# Patient Record
Sex: Female | Born: 1935 | Race: White | Hispanic: No | Smoking: Never smoker
Health system: Southern US, Community
[De-identification: ages and names within clinical notes are randomized; demographics above are authoritative.]

## PROBLEM LIST (undated history)

## (undated) DIAGNOSIS — F3289 Other specified depressive episodes: Secondary | ICD-10-CM

## (undated) DIAGNOSIS — Z8673 Personal history of transient ischemic attack (TIA), and cerebral infarction without residual deficits: Secondary | ICD-10-CM

## (undated) DIAGNOSIS — M810 Age-related osteoporosis without current pathological fracture: Secondary | ICD-10-CM

## (undated) DIAGNOSIS — I10 Essential (primary) hypertension: Secondary | ICD-10-CM

## (undated) DIAGNOSIS — E079 Disorder of thyroid, unspecified: Secondary | ICD-10-CM

## (undated) DIAGNOSIS — M4800 Spinal stenosis, site unspecified: Secondary | ICD-10-CM

## (undated) DIAGNOSIS — R7611 Nonspecific reaction to tuberculin skin test without active tuberculosis: Secondary | ICD-10-CM

## (undated) DIAGNOSIS — E669 Obesity, unspecified: Secondary | ICD-10-CM

## (undated) DIAGNOSIS — N182 Chronic kidney disease, stage 2 (mild): Secondary | ICD-10-CM

## (undated) DIAGNOSIS — F0280 Dementia in other diseases classified elsewhere without behavioral disturbance: Secondary | ICD-10-CM

## (undated) DIAGNOSIS — N63 Unspecified lump in unspecified breast: Secondary | ICD-10-CM

## (undated) DIAGNOSIS — F329 Major depressive disorder, single episode, unspecified: Secondary | ICD-10-CM

## (undated) DIAGNOSIS — E785 Hyperlipidemia, unspecified: Secondary | ICD-10-CM

## (undated) DIAGNOSIS — R569 Unspecified convulsions: Secondary | ICD-10-CM

## (undated) DIAGNOSIS — K644 Residual hemorrhoidal skin tags: Secondary | ICD-10-CM

## (undated) DIAGNOSIS — G309 Alzheimer's disease, unspecified: Secondary | ICD-10-CM

## (undated) HISTORY — DX: Spinal stenosis, site unspecified: M48.00

## (undated) HISTORY — DX: Major depressive disorder, single episode, unspecified: F32.9

## (undated) HISTORY — DX: Personal history of transient ischemic attack (TIA), and cerebral infarction without residual deficits: Z86.73

## (undated) HISTORY — DX: Age-related osteoporosis without current pathological fracture: M81.0

## (undated) HISTORY — DX: Hyperlipidemia, unspecified: E78.5

## (undated) HISTORY — PX: THYROIDECTOMY: SHX17

## (undated) HISTORY — DX: Alzheimer's disease, unspecified: G30.9

## (undated) HISTORY — DX: Dementia in other diseases classified elsewhere without behavioral disturbance: F02.80

## (undated) HISTORY — DX: Unspecified lump in unspecified breast: N63.0

## (undated) HISTORY — DX: Residual hemorrhoidal skin tags: K64.4

## (undated) HISTORY — DX: Nonspecific reaction to tuberculin skin test without active tuberculosis: R76.11

## (undated) HISTORY — DX: Unspecified convulsions: R56.9

## (undated) HISTORY — DX: Obesity, unspecified: E66.9

## (undated) HISTORY — DX: Chronic kidney disease, stage 2 (mild): N18.2

## (undated) HISTORY — DX: Other specified depressive episodes: F32.89

---

## 1964-11-01 HISTORY — PX: THYROIDECTOMY: SHX17

## 1964-11-01 HISTORY — PX: ABDOMINAL HYSTERECTOMY: SHX81

## 1968-11-01 HISTORY — PX: BACK SURGERY: SHX140

## 2004-11-01 DIAGNOSIS — M810 Age-related osteoporosis without current pathological fracture: Secondary | ICD-10-CM

## 2004-11-01 DIAGNOSIS — E669 Obesity, unspecified: Secondary | ICD-10-CM

## 2004-11-01 HISTORY — DX: Obesity, unspecified: E66.9

## 2004-11-01 HISTORY — DX: Age-related osteoporosis without current pathological fracture: M81.0

## 2005-11-01 DIAGNOSIS — M4800 Spinal stenosis, site unspecified: Secondary | ICD-10-CM

## 2005-11-01 DIAGNOSIS — F0280 Dementia in other diseases classified elsewhere without behavioral disturbance: Secondary | ICD-10-CM

## 2005-11-01 HISTORY — DX: Spinal stenosis, site unspecified: M48.00

## 2005-11-01 HISTORY — DX: Dementia in other diseases classified elsewhere, unspecified severity, without behavioral disturbance, psychotic disturbance, mood disturbance, and anxiety: F02.80

## 2006-05-30 ENCOUNTER — Encounter: Admission: RE | Admit: 2006-05-30 | Discharge: 2006-05-30 | Payer: Self-pay | Admitting: Specialist

## 2006-06-16 ENCOUNTER — Encounter: Admission: RE | Admit: 2006-06-16 | Discharge: 2006-06-16 | Payer: Self-pay | Admitting: Specialist

## 2006-07-02 HISTORY — PX: LUMBAR LAMINECTOMY: SHX95

## 2006-07-13 ENCOUNTER — Inpatient Hospital Stay (HOSPITAL_COMMUNITY): Admission: RE | Admit: 2006-07-13 | Discharge: 2006-07-18 | Payer: Self-pay | Admitting: Specialist

## 2006-07-14 ENCOUNTER — Ambulatory Visit: Payer: Self-pay | Admitting: Physical Medicine & Rehabilitation

## 2006-12-25 IMAGING — RF DG LUMBAR SPINE 2-3V
1 series · 2 of 2 positions shown · non-contrast
Comparison: none

CLINICAL DATA: 76-year-old, L-3 spinal stenosis. 
 LUMBAR SPINE ? 2 VIEW:

[Series 1: run · 2 of 2 slices shown]
[im 1/2]
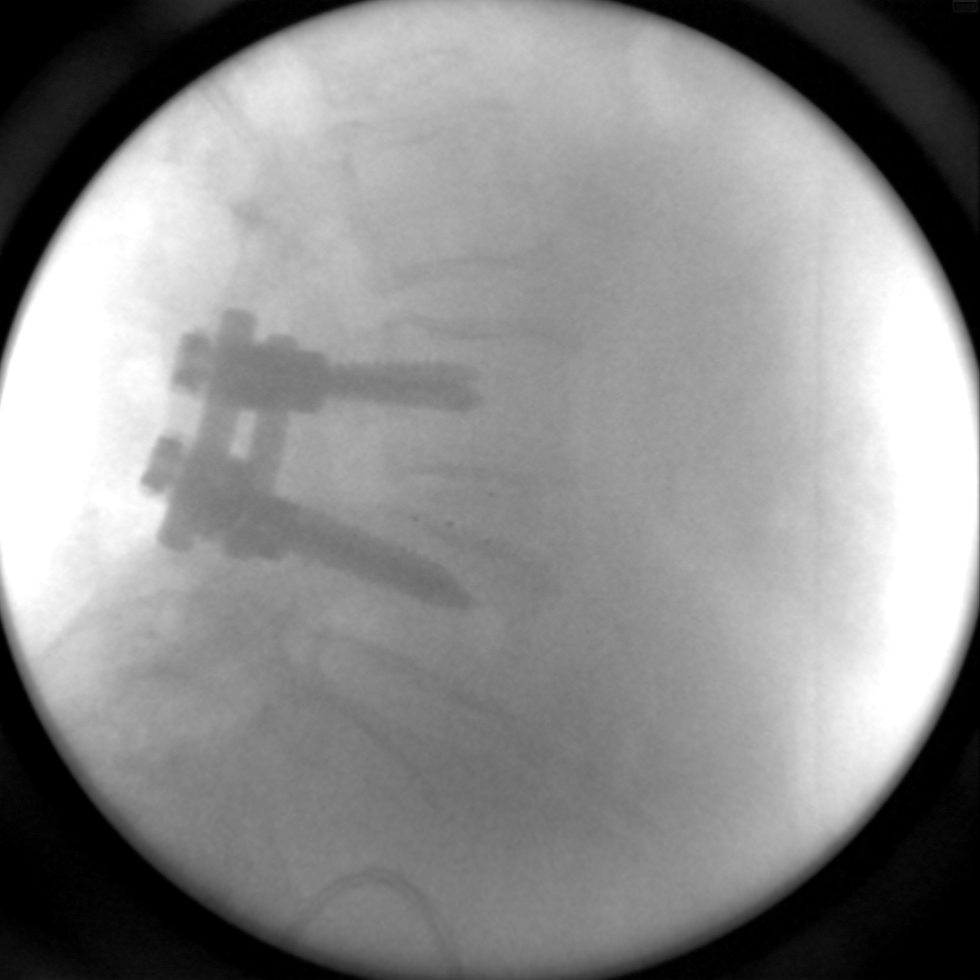
[im 2/2]
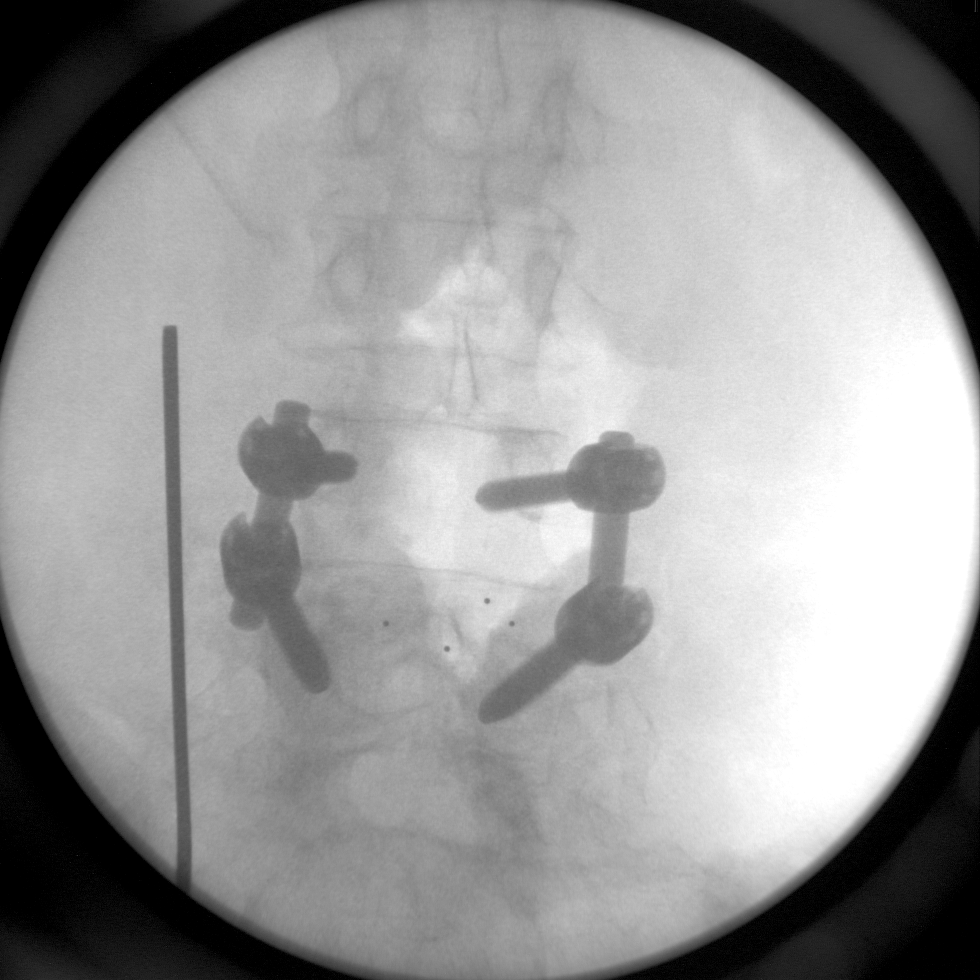

[2 of 2 positions shown; findings below may reference images not displayed]

FINDINGS: Two lumbar spot films from the operating room demonstrate pedicle screws and posterior rods and an interbody bone spacer fusion L4-5.  Hardware appears well positioned and there is normal overall alignment on the lateral film.
IMPRESSION: Posterior fusion hardware and interbody bone spacer at L4-5.

## 2007-11-02 DIAGNOSIS — F32A Depression, unspecified: Secondary | ICD-10-CM

## 2007-11-02 HISTORY — DX: Depression, unspecified: F32.A

## 2008-05-05 ENCOUNTER — Emergency Department (HOSPITAL_COMMUNITY): Admission: EM | Admit: 2008-05-05 | Discharge: 2008-05-05 | Payer: Self-pay | Admitting: Emergency Medicine

## 2008-11-01 DIAGNOSIS — N63 Unspecified lump in unspecified breast: Secondary | ICD-10-CM

## 2008-11-01 DIAGNOSIS — K644 Residual hemorrhoidal skin tags: Secondary | ICD-10-CM

## 2008-11-01 DIAGNOSIS — N182 Chronic kidney disease, stage 2 (mild): Secondary | ICD-10-CM

## 2008-11-01 HISTORY — DX: Unspecified lump in unspecified breast: N63.0

## 2008-11-01 HISTORY — DX: Residual hemorrhoidal skin tags: K64.4

## 2008-11-01 HISTORY — DX: Chronic kidney disease, stage 2 (mild): N18.2

## 2009-02-18 ENCOUNTER — Encounter: Admission: RE | Admit: 2009-02-18 | Discharge: 2009-02-18 | Payer: Self-pay | Admitting: Internal Medicine

## 2009-02-26 ENCOUNTER — Encounter: Admission: RE | Admit: 2009-02-26 | Discharge: 2009-02-26 | Payer: Self-pay | Admitting: Internal Medicine

## 2009-11-01 DIAGNOSIS — R7611 Nonspecific reaction to tuberculin skin test without active tuberculosis: Secondary | ICD-10-CM

## 2009-11-01 HISTORY — DX: Nonspecific reaction to tuberculin skin test without active tuberculosis: R76.11

## 2011-03-19 NOTE — Consult Note (Signed)
[name], [name]               ACCOUNT NO.:  [id number]   MEDICAL RECORD NO.:  [id number]          PATIENT TYPE:  INP   LOCATION:  5020                         FACILITY:  [facility]   PHYSICIAN:  Jonna L. Robb Matar, M.D.DATE OF BIRTH:  05/12/1929   DATE OF CONSULTATION:  DATE OF DISCHARGE:                                   CONSULTATION   PRIMARY CARE PHYSICIAN:  Dr. Evie Lacks.   REASON FOR CONSULTATION:  Diabetes control.   HISTORY OF PRESENT ILLNESS:  The patient, herself, is rather confused and  not able to give me much information, so the information is from the old  records.  The patient has come in for a lumbar laminectomy, L3-4, secondary  to significant spinal stenosis.  She had been having recurring problems with  back pain.   PAST MEDICAL HISTORY:  1. Type 2 diabetes, she has had this for the last 5 years.  Does have      problems with urinary incontinence, but no polydipsia or hypoglycemia.      Sugars were noted to run between 112 and 138, but here in the hospital,      they have run up into the 180s.  She is just on oral therapy.  2. Hypothyroidism.  The patient is post thyroidectomy.  I am not sure what      her original problem was.  There was a note that her TSH had been low      and Synthroid had been adjusted.  That was back last month and it is      due to be rechecked again.  3. Osteoporosis.  4. Hyperlipidemia.   ALLERGIES:  THE PATIENT HAD A DELIRIOUS REACTION TO PERCOCET.   OPERATIONS:  1. Hysterectomy.  2. Thyroidectomy.  3. She did have some previous back surgery in 1970.   FAMILY HISTORY:  Diabetes.   SOCIAL HISTORY:  Nonsmoker.  Occasional wine.  No drugs.  She is a resident  of a skilled nursing facility.  Has 2 children.   MEDICATIONS:  At the skilled nursing facility were:  1. Allegra 180 daily.  2. Aspirin 81 daily.  3. Glyburide 2.5 daily.  4. Glucophage 500 daily.  5. Fosamax 70 weekly.  6. Os-Cal D.  7. Vytorin 10/20 daily.  8.  Synthroid 150 daily.  9. Mobic 15 daily.  10.Aricept 5 daily.  11.Lisinopril 5 daily.  12.Centrum Silver.   REVIEW OF SYSTEMS:  The patient, directly, she says she is not having chest  pain or difficulty breathing.  Her back is sore.  She denies nausea and  vomiting.  She pulled out her IV.  I had to remind her that she had back  surgery.   LABORATORY WORK:  Sugars, as noted, are 182 and 84.  Chest x-ray on  September 11 was normal.  Hemoglobin had dropped to 7.1, but is now up to  10.3.  Renal function good with a BUN of 8, creatinine 0.9.  EKG was normal.   PHYSICAL EXAM:  GENERAL:  Pleasant, confused, pale, elderly white female.  VITAL SIGNS:  Temp 100.6.  Pulse 84.  Respirations 20.  Blood pressure  140/70.  HEENT:  Pupils are slightly constricted.  Extraocular movements are full.  Pharynx is normal.  There are no carotid bruits or thyromegaly.  She has a  scar at the base of her neck.  RESPIRATORY:  Respiratory efforts are normal.  The lungs are clear to A&P  with no wheezing, rhonchi or dullness.  BREASTS:  Show no masses or tenderness.  HEART:  Is a regular rate and rhythm.  Normal S1 and S2 without murmurs,  rubs or gallops.  ABDOMEN:  Is nontender.  No hepatosplenomegaly.  No femoral bruits.  EXTREMITIES:  No cyanosis, clubbing or edema.  BACK:  There is a dressing on her back.   IMPRESSION:  1. Type 2 diabetes.  I will check her A1c, but she is sitting there with a      lot of sugars on her clear liquids.  I suggest putting her back on a      more regular diet and I will increase her metformin to b.i.d.  I do not      think we should start insulin at this time.  2. Hypertension.  This is well controlled.  Continue her ACE inhibitor.  3. Osteoporosis.  4. Hypothyroidism.  We should recheck her T4, TSH and in light of her      confusion I may just check a B12 level since she already has 2      endocrinopathies.  5. Spinal stenosis.  She is post laminectomy.  6.  Hyperlipidemia.  I will check her fasting lipids.  7. Mild dementia.  Continue her Aricept.   Thank you for the consultation.  I would be happy to monitor her medical  issues while she is here.      Jonna L. Robb Matar, M.D.  Electronically Signed     JLB/MEDQ  D:  07/14/2006  T:  07/14/2006  Job:  [id number]

## 2011-03-19 NOTE — Discharge Summary (Signed)
[name], [name]               ACCOUNT NO.:  [id number]   MEDICAL RECORD NO.:  [id number]          PATIENT TYPE:  INP   LOCATION:  5020                         FACILITY:  [facility]   PHYSICIAN:  Kerrin Champagne, M.D.   DATE OF BIRTH:  05-18-1929   DATE OF ADMISSION:  07/13/2006  DATE OF DISCHARGE:  07/18/2006                           DISCHARGE SUMMARY - REFERRING   DISCHARGE DIAGNOSES:  1. Severe lumbar spinal stenosis with concurrent grade 1 spondylolisthesis      L3-4.  2. Type 2 diabetes mellitus.  3. Hypothyroidism.  4. Dyslipidemia.  5. Mild dementia.  6. Hypertension.  7. Urinary incontinence.  8. Status post lumbar decompression and fusion L4 of the sacrum 1970.  9. Status post hysterectomy.   PROCEDURE:  July 13, 2006 - Central laminectomy L3-4 with bilateral L3  and bilateral L4 nerve root decompression. Right sided transforaminal lumbar  inner body fusion L3-4 with 10 mm lordotic leopard cage and local bone  graft. Posterolateral fusion L3-4 with local and allograft Symphony bone  graft. Pedicle screw and rod fixation L3-4.   SURGEON:  Vira Browns, M.D.   ASSISTANT:  Annell Greening, M.D.   ESTIMATED BLOOD LOSS:  500 cc. The patient received 2 units of autogenous  blood and cell saver blood during surgery.   CONSULTATIONS:  On July 14, 2006, Dr. Miachel Roux L. Robb Matar, Internal  Medicine consultation.   CHIEF COMPLAINT:  Severe back pain with increasing debilitation and  inability to walk with urinary incontinence.   HISTORY OF PRESENT ILLNESS:  The patient is a 77 year old female who has  undergone previous lumbar decompression and fusion from L4 to sacrum in  1970's in [place]. She has recently been having increasing  difficulties with standing and ambulation. This has been worsened to the  point where she is nearly all the time in a wheelchair, unable to ambulate  any distance because of severe back pain and weakness in her legs. She has  baseline  urinary incontinence. She was seen and evaluated in the office with  diffuse weakness in both lower extremities. Underwent preoperative studies,  which demonstrated severe lumbar spinal stenosis L3-4, flexion and extension  radiographs as well as myelogram demonstrating movement at the L3-4 segment  of 3 to 4 mm, up to nearly 9 mm, and listhesis at L3 and L4. The patient had  a very short duration of pain relief with epidural steroid. Felt to require  decompression and fusion in order to relieve her pain.   ALLERGIES:  PERCOCET (hallucinations.)   ADMISSION MEDICATIONS:  1. Allegra 180 mg p.o. each a.m.  2. Aspirin 81 mg p.o. each a.m.  3. Glyburide 2.5 mg p.o. each a.m.  4. Fosamax 70 mg p.o. each week.  5. Vytorin 10/20 mg 1 p.o. each a.m.  6. Synthroid 150 mcg 1 tablet p.o. each a.m.  7. Mobic 15 mg p.o. each a.m.  8. Aricept 5 mg p.o. each p.m.  9. Lisinopril 5 mg p.o. each p.m.  10.Glucophage 500 mg p.o. each a.m.  11.Centrum Silver 1 tablet p.o. each a.m.  12.Os-Cal 500 mg b.i.d.  13.Tylenol 500 mg p.o. b.i.d.  14.Oral Peridex q.h.s.  15.Vicodin for discomfort.   FAMILY HISTORY:  Significant for diabetes.   PAST SURGICAL HISTORY:  Hysterectomy and thyroidectomy. She has had previous  lumbar surgery in 1990.   SOCIAL HISTORY:  Non-smoker. Drinks occasional wine. She is a resident at  the [facility]. Has 2 children, both are attentive  to her.   LABORATORY DATA:  Preoperative  lab data showed CBC with hemoglobin and  hematocrit of 11.3 and 33.0 percent. Platelet count of 229,000. The  patient's PT was 13, INR 1.0, PTT 25. Her routine chemistries demonstrated a  sodium of 141, potassium 4.3, chloride 106, CO2 27, glucose 121, BUN 15,  creatinine 1.0, calcium 9.6. Total protein 7.2 and albumin 4.2. The  patient's liver function studies, AST 34, ALT 32. Her ALP was 35, T-bili  0.8. A constellated hemoglobin was 6.0. Lipid profile:  Triglycerides  74,  total cholesterol HDL ratio of 2.4. Her low density lipoprotein and very low  density lipoprotein cholesterol were within normal limits. Her cholesterol  was 100. Her T4 during admission was 8.3 with TSH level of 0.077. Her B12  was 294, normal. Urinalysis during admission showed ketones of 15, otherwise  normal.   A chest x-ray showed chronic parenchymal changes without evidence of acute  or superimposed abnormality, coarse bronchovascular markings, stable.  Tortuous thoracic aorta, no effusion.   Her EKG showed normal sinus rhythm, normal ECG.   HOSPITAL COURSE:  [name] was admitted after undergoing the above  surgical procedure on July 13, 2006. Intraoperatively, she had acute  blood loss with the necessity of autogenous blood transfusions of 2 units.  She was also given cell saver blood during her surgical procedure.  Postoperatively, in the recovery room, a hemoglobin and hematocrit  demonstrated that the patient's hemoglobin was 10.7 and her hematocrit 31.2  percent. Postoperatively, this remained fairly stable with her last test on  July 17, 2006, indicating a hemoglobin of 9.5 and a hematocrit of 27.5  percent. White cell count 6.8,000. The patient recovered in the  postoperative recovery unit and then was transferred to a regular orthopedic  floor where she was log rolled q.2 hours. She had a Hemovac drain, which was  removed on postoperative day 1. She was fitted for a lumbar brace and this  was obtained from [facility] via the orthopedic technician on postoperative  day 1, to be applied when she was out of bed and ambulating. The patient had  excellent bowel sounds throughout her hospitalization. These did not stop  postoperatively. She was tolerating a clear liquid diet on postoperative day  1 and 2. She ran low grade temperatures of up to 101.5 on postoperative day 2. This eventually resolved and diminished to temperatures in the range of  98 to  99. Postoperative chest x-ray was obtained because of low grade  temperatures and showed no active disease. Urinalysis was obtained, which  demonstrated no abnormalities other than the elevation of ketones. The  patient's sugars were controlled and a consultation was obtained for  perioperative maintenance of this patient's medical illnesses including  hypertension and her diabetes. Her sugars remained well controlled in the  120 to 140 to 150 level. She remained with very mild hypertension. On  postoperative day 4, the patient's family had found a nursing home, which  was acceptable to their preferences with [facility]. The  patient, pre-admission, had been staying at an assisted care facility  with  [facility]. However, post-discharge, they would be required to maintain her  previous status as well as an inpatient rehabilitation program. With this,  they felt that a short term stay in a skilled nursing facility would be  appropriate. On postoperative day 3, July 16, 2006, the patient's  bowels were in working order. She voided without difficulty. She did,  however, have infiltration of the IV in the right forearm with swelling of  the right hand and some bruising of her index finger. The swelling markedly  resolved over the next 24 hours. On July 17, 2006, the patient was  seen. Her temperature had decreased and she was running temperatures in the  98 to 99 level. Workup was unremarkable. Workup of hypothyroidism indicated  that the patient likely would require a decrease in her thyroid medications  to 100 mcg per day. She was placed on Nu-iron for postoperative anemia, post-  hemorrhage anemia. Physical therapy continued to work with this patient  throughout her hospitalization and noted that she required continued standby  assistance with nearly maximum assistance in ambulation. She continued to  ambulate with a stoop gait using a walker with plenty of  encouragement. The  patient's Fosamax was held during her hospitalization, as this is a normal  course in this hospital. Her diet remained low carbohydrate, no added salt,  or salty foods. The patient was judged to be stable for transfer to  [facility] on July 18, 2006.   DISCHARGE MEDICATIONS:  1. Claritin 10 mg 1 tablet p.o. daily.  2. Os-Cal Plus D 250 mg of calcium b.i.d.  3. Tylenol 500 mg 1 tablet p.o. b.i.d.  4. Peridex 0.12% oral rinse 15 cc and spit out excess q.h.s.  5. Glyburide 2.5 mg p.o. daily with meals.  6. Vytorin 10/20 tablet, 1 tablet p.o. q. 1800.  7. Mobic 15 mg 1 tablet p.o. daily.  8. Aricept 5 mg 1 tablet p.o. q.h.s.  9. Lisinopril 5 mg p.o. daily.  10.Multivitamin tablet, 1 tablet p.o. daily.  11.MiraLax 17 grams powder dissolved in 8 ounces of water daily.  12.Robaxin 500 mg p.o. q.8 hours p.r.n. spasm.  13.Synthroid 100 mcg p.o. q.12  noon. 14.Vitamin B12 100 mcg p.o. daily.  15.Reglan 10 mg p.o. q.8 hours p.r.n.  16.Compazine 5 mg p.o. q.6 hours p.r.n. nausea.  17.Benadryl 25 mg p.o. q.h.s. p.r.n. sleep.  18.Vicodin 1 to 2 tablets p.o. q.4 to 6 hours p.r.n. pain.  19.Restoril 15 to 30 mg p.o. q.h.s. p.r.n. sleep.   DISPOSITION:  [name] was transferred to [facility] on July 18, 2006 via ambulance.   STATUS:  Ambulation with a brace, lumbosacral orthosis, Aspen type and when  out of bed, to use brace. She may remove the brace while lying in bed. She  is to continue with physical therapy, progressive ambulation, lower  extremity strengthening exercises, to try to improve her endurance.   FOLLOWUP:  She should be seen back in my office at [facility],  telephone number [phone], in 2 weeks from the time of her discharge.   CONDITION ON DISCHARGE:  Improved.      Kerrin Champagne, M.D.  Electronically Signed     JEN/MEDQ  D:  07/17/2006  T:  07/17/2006  Job:  [id number]   cc:   Lonia Blood,  M.D.  Lenon Curt Chilton Si, M.D.

## 2011-03-19 NOTE — Op Note (Signed)
[name], [name]               ACCOUNT NO.:  [id number]   MEDICAL RECORD NO.:  [id number]          PATIENT TYPE:  INP   LOCATION:  5020                         FACILITY:  [facility]   PHYSICIAN:  Kerrin Champagne, M.D.   DATE OF BIRTH:  Sep 14, 1929   DATE OF PROCEDURE:  07/13/2006  DATE OF DISCHARGE:                                 OPERATIVE REPORT   PREOPERATIVE DIAGNOSES:  1. Severe lumbar spinal stenosis L3-4 above and L4 to sacrum fusion.  2. Grade 1 spondylolisthesis L3-4, worsening with flexion.   POSTOPERATIVE DIAGNOSES:  1. Severe lumbar spinal stenosis L3-4 above and L4 to sacrum fusion.  2. Grade 1 spondylolisthesis L3-4, worsening with flexion.   PROCEDURE:  1. Central decompressive laminectomy L3.  2. Decompression of bilateral L3 and bilateral L4 nerve roots.  3. Complete facetectomy right L3-4.  4. Right transforaminal lumbar interbody fusion using a 10-mm lordotic      leopard cage with local bone graft.  5. Posterior lateral fusion L3-L4 using a combination of local and      Symphony allograft bone graft materials.  6. Posterior instrumentation L3-4 using Monarch pedicle screws and rods.  7. Intraoperative EMG and nerve conduction monitoring.   SURGEON:  Kerrin Champagne, M.D.   ASSISTANT:  Veverly Fells. Ophelia Charter, M.D.   ANESTHESIA:  General via oral tracheal intubation, Dr. Janetta Hora. [place].   ESTIMATED BLOOD LOSS:  500 mL.   DRAINS:  Foley to straight drain, Hemovac x1, right lumbar.   HISTORY OF PRESENT ILLNESS:  The patient is a 77 year old female has  undergone previous lumbar laminectomy surgery and fusion from L4 to sacrum  in the 1970s.  She has done well up until nearly 1/2 year ago when she began  having increasing pain and discomfort with standing and ambulation.  She has  progressively worsened to the point where she is primarily in a wheelchair  at the assisted-living facility [facility].  She is unable to stand for any  length of time or ambulate due  to lower extremity weakness.  Work up her  back is demonstrated and severe lumbar spinal stenosis segmented at L3-4  just above her fusion segment from L4 to sacrum with flexion/extension views  she is noted to go from a 2- to 3-mm spondylolisthesis up to nearly 9 mm.  She is brought to the operating room to undergo central decompression of the  L3-4 segment with fusion using a transforaminal lumbar interbody technique  with posterior lateral fusion combined and posterior instrumentation.   INTRAOPERATIVE FINDINGS:  As discussed above.   DESCRIPTION OF PROCEDURE:  After adequate general anesthesia the patient was  placed in the prone position.  Arms at the side well-padded.  Foley catheter  was placed prior to turning.  Bilateral lower extremity TED stockings, full  length were applied as well as intraoperative electrodes on the legs and  large muscles on each lower extremity and upper extremity as well.  The  patient had a standard prep with DuraPrep solution from the lower lumbar  spine to the midsacral segment and also carried up  to the middorsal spine.  She was draped in the usual manner and iodine by drape was used.  She had  standard preoperative antibiotics, Ancef.  The old previous incision scar  was marked out.  The upper half of this was used with the incision.  The  total incision length approximately 4-5 inches in length through the skin  and subcutaneous layers eclipsing the old incision scar at the lower half of  the incision.  Incision was carried sharply down to the lumbodorsal fascia.  Incision was then made on both sides of the lumbodorsal fascia exposing the  posterior spinous processes.  Clamps were then placed on the expected  spinous processes of L3 and L2.  Intraoperative radiograph noted these to be  at L2 and at L3 spinous processes.  Incision was carried caudally.  An  additional segment exposed the spinous process of L4 inferiorly.  Leksell  rongeur,  small  Beyer rongeur was used to mark the spinous process of L3 and  of L2.  Cobbs were used to elevate the paralumbar muscles off the posterior  aspect of the lamina and the lateral aspect of the spinous process of L4-L3  and L2 carrying up to L1.  The paraspinous muscles were stripped laterally.  The facet capsules of the L3-L4 level were resected bilaterally and the  dissection carried lateral exposing the transverse process at the superior  aspect of the L4 level lateral to the L3-L4 facet.  There was bone formation  abundant present along the posterior aspect of the L4-L5 level carrying out  posterolaterally at this segment some of it was debrided over its superior  aspect to allow for exposure of the transverse process bilaterally at the L4  level.  Exposure was also obtained out to the facet of the L2-L3 level.  The  facette capsules at this level were preserved and dissection carried lateral  to the facet exposing the transverse process of L3 bilaterally at this  level.  Each of the lateral gutters were packed to decrease bleeding.  Electrocautery and bipolar electrocautery were used to control bleeders.  Viper retractor was placed in order to obtain excellent exposure of the  transverse processes of L3-L4 to allow for adequate exposure for placement  of pedicle screws.  Next, a Leksell rongeur was used to remove the spinous  process of L3 as well as the superior 25% of the spinous process of L4 and  inferior 30% of spinous process of L2.  The central portions of the lamina  were carefully thinned using Leksell rongeur.  The osteotome 1/2-inch was  used to osteotomized the facet on the right side first removing the inferior  articular facet process on the right side at the L3 level and then  continuing removing bone centrally and on the left side, resecting bone over the inferior articular process, exposing the superior articular process of  L4 on both sides.  A 3-mm Kerrison was introduced  above.  The lamina of L4  was used to resect a small portion of the lamina of L4 superiorly on the  right side and left side in the midline.  This carefully freed up the  ligamentum flavum.  Foraminotomies were performed over both L4 nerve roots.  Osteotomes were then used to osteotomized the superior articular process at  L4 near the medial border of the pedicle of L4.  This was then removed using  2-mm Kerrison's on both sides while decompressing the central canal at the  level of its most significant stenosis.  Central laminectomy was then  carried up to the L2-L3 level using 3 mm Kerrison's resecting central  portions of the lamina superiorly to the L3-L4 level.  Central laminectomy  site was then widened bilaterally using 3 mm Kerrison's performing lateral  recess decompression of the L2-L3 level, both left and right side.  Carrying  the resection on the right side out the L3 neuroforamen resecting the  inferior taken to process totally overlying the foramen on this side then  resecting the residual remnants of the L4 superior articular process on the  right side out the neuroforamen exposing and decompressing the L3 nerve root  in process.  This completed the interval between the L3 nerve root and the  lateral aspect of the thecal sac and was carefully freed up using a Penfield  4.  Bleeders were controlled using bipolar electrocautery in the posterior  lateral aspect of the disk at the L3-4 level and was carefully exposed.  Gelfoam placed for hemostasis.  Bone wax applied to bleeding bone surfaces  were necessary.  A C-arm was brought into the field.  This was first used to  initially identify the spinous process of L3-L4.  Now, it was prepped and in  a lateral position.  The awl was placed at the expected level at the L4  level on the right side and the position within the midportion of the  pedicle.  It was observed on the C-arm to be in adequate positional  alignment.  Using a  straight awl then entry was made at the intersection of  the transverse process near the midsection of the transverse process with  its intersection with the superior articular process of L4 and careful  convergence was carried out as well as a proper degree of angulation.  Lordosis was maintained and observed on the C-arm after initial entry to be  in proper positional alignment carefully guided down the intermedullary  portion of the pedicle.  Carefully feeling the pedicle as it was placed.  A  40-mm screw was chosen.  This was then tapped with a 6.25 tap and a 7 x 4 mm  screw was placed at this level.  At the L3 level on the right side an awl  was placed again within the central portion of the pedicle on the lateral  view and in line with the pedicle and then a pedicle finder used to probe  the pedicle with careful convergence again measuring depth and a 35 mm screw was placed at this level obtaining excellent purchase.  A 6.25 x 35 was  used.  Tapping with a 5.5 tap.  Note that decortication was carried out  between the period of tapping and then the placement of screws.  Bone graft  was placed out posterolateral using Symphony bone graft material and local  bone graft provided.  The left side was then carefully instrumented using an  awl placing the awl at the expected entry point of the midportion of the  pedicle seen on lateral view converging with the handheld pedicle finder  with the proper degree of convergence at L4.  Measuring and carefully  testing the integrity of the pedicle using a ball-tipped probe in between  probing with the pedicle prober and then tapping and after tapping and then  placing the screw.  Again high-speed burr was used carefully to decorticate  the transverse process at L4, to measuring and tapping with a 6.25 tap and  placing a 40 mm screw on the left side at the L4 level.  At the L3 level  again an awl was used to carefully align the end and determine the  entry  point into the pedicle and the midportion of the pedicle.  Careful  convergence with the hand-held pedicle finder converging properly  in the  lordotic nature of the pedicle.  This was then measured for depth and tapped  with a 5.5 tap, ball-tipped probe used to ensure the integrity of the  pedicle and a 35 x 6.25 screw was placed at the L3 level on the left side  without difficulty was noted.  Again, decortication was carried out between  the tapping of the pedicle and the placement of the screw and placement of  the bone graft posterolateral was performed on both sides.  Each of the  pedicle screws were then measured for soft tissue resistance indicating  between 35 and 40 at each level.  This was adequate.  This completed each of  the fasteners so the screws were carefully loosened up using the head  loosener provided.  The smallest length curved rod 45 mm in length was then  placed into the fasteners of the screws at the L3-L4 level on both sides and  caps were then applied to the screw fasteners at each segment.  Returning to  the right side then using the Rico retractors, it carefully retracted the L3  nerve root and the thecal sac.  The incision was made in the posterolateral  corner of the disk using 15-blade scalpel.  The disk was then debrided of  any residual disk material using pituitary rongeurs, a laminar spreader was  used to carefully spread the lamina between the L4 and the L2 level.  Disk  space was then entered and dilated using a 8-, 9- and 10-mm dilators.  Pituitary rongeurs were then used to debride the disk space of further  degenerative disk material.  The endplates were then decorticated using the  curettes provided.  Straight curettes upbiting right and upbiting left  curettes as well as ring curettes.  This completed debridement of the  endplates of any cartilaginous material down to bleeding bone endplates.  As best as possible the bony endplates were  preserved.  Irrigation was carried  out of the disk space.  Trialing the disk space, a 9-mm then 10-mm lordotic  cage was chosen.  Paravertebral disk space was then packed with morselized  local bone graft for much as possible.  Then using a trial 8 mm impactor of  a trial cage was then carried out.  This impacted the bone graft into good  positional alignment allowing for space for the placement of the expected 10  mm cage.  The 10 mm cage was then carefully packed with additional local  bone graft material.  The cage was then placed into the disk space and  impacted into place.  It was then carefully rotated with the kicker  impactors to a transverse position of the midline.  With this completed then  the soft tissues were allowed to fall back into place.  Bleeders were  controlled using bipolar electrocautery.  Hockey stick neural probe was used  to probe the nerves in both L4 and in both the L3 nerve roots and ensure  patency of their neuroforamen.  No further nerve compression.  None was  present.  There was no disk material or bony material within the spinal  canal noted.  With then the fasteners at the L3 level were carefully  connected to 85 pounds using the Danek torque arm holding device and the  torque wrench.  Compression was obtained between the L3 and the L4 screws  using a compressor provided and on the right side the L4 fastener was  tightened to the rod to the 85 pounds.  Then on the left side similarly with  compression across the two screw heads, the L4 fastener was tightened to the  rod.  Irrigation was carried out.  Platelet portion of the patient's plasma  was then sprayed on to the wound to provide hemostasis.  Medium Hemovac  drain was placed in the depth of the incision exiting over the right lower  lumbar region.  Lumbodorsal fascia was approximated after first  approximating some of the muscle layers in the midline at the L3 segment  using #1 Vicryl sutures.   Lumbodorsal fascia was approximated with  interrupted figure-of-eight simple sutures of #1 Vicryl.  Deep subcu layers  were approximated with interrupted #1 and then zero Vicryl sutures and used  to close deep subcu and the more superficial subcu layers approximated  interrupted 2-0 Vicryl sutures and skin closed with a running subcu stitch  of 4-0 Vicryl.  Tincher of benzoin and Steri-Strips applied; 4x4s and ABD  pad were affixed to the skin with Hypafix tape.  Hemovac drain attached to  the dressing with pink tape.  Hemovac drain was charged.  The patient was  then returned to the supine position, reactivated, extubated and returned to  the recovery room in satisfactory condition.  Note that at the end of the  case permanent C-arm images were obtained in  the A/P and lateral planes demonstrating the screws to be converging well  within in the body of L3 and L4 and the cage to be in excellent positional alignment within the midline.  The patient was returned to the recovery room  in satisfactory condition.  Also instrument and sponge counts were correct.      Kerrin Champagne, M.D.  Electronically Signed     JEN/MEDQ  D:  07/15/2006  T:  07/18/2006  Job:  [id number]

## 2011-09-02 DIAGNOSIS — Z8673 Personal history of transient ischemic attack (TIA), and cerebral infarction without residual deficits: Secondary | ICD-10-CM

## 2011-09-02 HISTORY — DX: Personal history of transient ischemic attack (TIA), and cerebral infarction without residual deficits: Z86.73

## 2011-09-12 ENCOUNTER — Emergency Department (HOSPITAL_COMMUNITY): Payer: Medicare Other

## 2011-09-12 ENCOUNTER — Encounter: Payer: Self-pay | Admitting: Emergency Medicine

## 2011-09-12 ENCOUNTER — Inpatient Hospital Stay (HOSPITAL_COMMUNITY)
Admission: EM | Admit: 2011-09-12 | Discharge: 2011-09-15 | DRG: 065 | Disposition: A | Discharge status: Skilled Nursing Facility | Payer: Medicare Other | Attending: Internal Medicine | Admitting: Internal Medicine

## 2011-09-12 DIAGNOSIS — I6350 Cerebral infarction due to unspecified occlusion or stenosis of unspecified cerebral artery: Principal | ICD-10-CM | POA: Diagnosis present

## 2011-09-12 DIAGNOSIS — R5381 Other malaise: Secondary | ICD-10-CM | POA: Diagnosis present

## 2011-09-12 DIAGNOSIS — G8191 Hemiplegia, unspecified affecting right dominant side: Secondary | ICD-10-CM | POA: Diagnosis present

## 2011-09-12 DIAGNOSIS — N390 Urinary tract infection, site not specified: Secondary | ICD-10-CM | POA: Diagnosis present

## 2011-09-12 DIAGNOSIS — G8190 Hemiplegia, unspecified affecting unspecified side: Secondary | ICD-10-CM | POA: Diagnosis present

## 2011-09-12 DIAGNOSIS — E119 Type 2 diabetes mellitus without complications: Secondary | ICD-10-CM | POA: Diagnosis present

## 2011-09-12 DIAGNOSIS — I639 Cerebral infarction, unspecified: Secondary | ICD-10-CM

## 2011-09-12 DIAGNOSIS — Z79899 Other long term (current) drug therapy: Secondary | ICD-10-CM

## 2011-09-12 DIAGNOSIS — Z794 Long term (current) use of insulin: Secondary | ICD-10-CM

## 2011-09-12 DIAGNOSIS — E039 Hypothyroidism, unspecified: Secondary | ICD-10-CM | POA: Diagnosis present

## 2011-09-12 DIAGNOSIS — Z7902 Long term (current) use of antithrombotics/antiplatelets: Secondary | ICD-10-CM

## 2011-09-12 DIAGNOSIS — Z66 Do not resuscitate: Secondary | ICD-10-CM | POA: Diagnosis present

## 2011-09-12 DIAGNOSIS — I10 Essential (primary) hypertension: Secondary | ICD-10-CM | POA: Diagnosis present

## 2011-09-12 DIAGNOSIS — I509 Heart failure, unspecified: Secondary | ICD-10-CM | POA: Diagnosis present

## 2011-09-12 DIAGNOSIS — Z7982 Long term (current) use of aspirin: Secondary | ICD-10-CM

## 2011-09-12 DIAGNOSIS — E1122 Type 2 diabetes mellitus with diabetic chronic kidney disease: Secondary | ICD-10-CM | POA: Diagnosis present

## 2011-09-12 DIAGNOSIS — Z833 Family history of diabetes mellitus: Secondary | ICD-10-CM

## 2011-09-12 DIAGNOSIS — E785 Hyperlipidemia, unspecified: Secondary | ICD-10-CM | POA: Diagnosis present

## 2011-09-12 DIAGNOSIS — F0390 Unspecified dementia without behavioral disturbance: Secondary | ICD-10-CM | POA: Diagnosis present

## 2011-09-12 DIAGNOSIS — I5032 Chronic diastolic (congestive) heart failure: Secondary | ICD-10-CM | POA: Diagnosis present

## 2011-09-12 HISTORY — DX: Essential (primary) hypertension: I10

## 2011-09-12 HISTORY — DX: Disorder of thyroid, unspecified: E07.9

## 2011-09-12 LAB — COMPREHENSIVE METABOLIC PANEL
Albumin: 3.3 g/dL — ABNORMAL LOW (ref 3.5–5.2)
Alkaline Phosphatase: 56 U/L (ref 39–117)
BUN: 19 mg/dL (ref 6–23)
Chloride: 98 mEq/L (ref 96–112)
Glucose, Bld: 135 mg/dL — ABNORMAL HIGH (ref 70–99)
Potassium: 3.9 mEq/L (ref 3.5–5.1)
Total Bilirubin: 0.4 mg/dL (ref 0.3–1.2)

## 2011-09-12 LAB — CBC
HCT: 37.4 % (ref 36.0–46.0)
Hemoglobin: 12.1 g/dL (ref 12.0–15.0)
MCH: 31.6 pg (ref 26.0–34.0)
MCV: 97.7 fL (ref 78.0–100.0)
RBC: 3.83 MIL/uL — ABNORMAL LOW (ref 3.87–5.11)

## 2011-09-12 LAB — URINALYSIS, ROUTINE W REFLEX MICROSCOPIC
Bilirubin Urine: NEGATIVE
Nitrite: POSITIVE — AB
Specific Gravity, Urine: 1.034 — ABNORMAL HIGH (ref 1.005–1.030)
pH: 5.5 (ref 5.0–8.0)

## 2011-09-12 LAB — URINE MICROSCOPIC-ADD ON

## 2011-09-12 LAB — GLUCOSE, CAPILLARY: Glucose-Capillary: 131 mg/dL — ABNORMAL HIGH (ref 70–99)

## 2011-09-12 MED ORDER — DEXTROSE 5 % IV SOLN
1.0000 g | Freq: Once | INTRAVENOUS | Status: AC
  Administered 2011-09-13: 1 g via INTRAVENOUS
  Filled 2011-09-12: qty 10

## 2011-09-12 NOTE — ED Notes (Addendum)
Dr.glick asked to speak with pt

## 2011-09-12 NOTE — ED Notes (Signed)
Dr. glick at bedside 

## 2011-09-12 NOTE — ED Notes (Signed)
Lab in to draw blood.

## 2011-09-12 NOTE — ED Notes (Signed)
md informed that pt has deficits to right side based on charting rn assessment

## 2011-09-12 NOTE — ED Notes (Signed)
Per ems pt sent from facility with right sided weakness that began around 4 pm today and left sided weakness since this morning. Per ems facility stated pt had left sided facial droop. Per ems pt shows no weakness to left or right side nor left sided facial droop. Pt has hx alzheimers. Bilateral grips strong

## 2011-09-12 NOTE — ED Provider Notes (Signed)
History     CSN: [id number] Arrival date & time: 09/12/2011  6:21 PM   First MD Initiated Contact with Patient 09/12/11 1844      Chief Complaint  Patient presents with  . Weakness    (Consider location/radiation/quality/duration/timing/severity/associated sxs/prior treatment) Patient is a 77 y.o. female presenting with weakness. The history is provided by the nursing home and a relative. History Limited By: History is limited by dementia.  Weakness  Additional symptoms include weakness.   77 year old female was sent from a nursing home because of left-sided weakness. She was reported to have shown a left facial droop and left-sided weakness at 1600. It is not known when the last time she was seen normal. She is DO NOT RESUSCITATE. Patient is unable to give any relevant history because of severe dementia. Family member is here he states that her speech is somewhat dysarthric compared to baseline and also states that her inability to answer questions is less than her baseline.  Past Medical History  Diagnosis Date  . Hypertension   . Diabetes mellitus   . Thyroid disease     No past surgical history on file.  No family history on file.  History  Substance Use Topics  . Smoking status: Unknown If Ever Smoked  . Smokeless tobacco: Not on file  . Alcohol Use: No    OB History    Grav Para Term Preterm Abortions TAB SAB Ect Mult Living                  Review of Systems  Unable to perform ROS Neurological: Positive for weakness.    Allergies  Roxicet  Home Medications  No current outpatient prescriptions on file.  BP 150/98  Pulse 100  Resp 18  Physical Exam  Nursing note and vitals reviewed.  77 year old female awake and alert and in no acute distress. Vital signs show mild hypertension with blood pressure 150/98. PERRLA, EOMI. Fundi show no hemorrhages or exudates. Mild conjunctival drainage is present bilaterally. There is a very mild right central facial  droop present. Neck is supple without adenopathy, JVD, or bruit. Lungs are clear without rales wheezes or rhonchi. Heart regular rate and rhythm without rub. Abdomen is soft flat nontender with peristalsis present. Extremities show 1+ edema. Skin a faint reticular rash is present over the right ankle and foot and is nonspecific in appearance. Neurologic: She is oriented to person but not to place or time. Speech is slightly slow and mildly dysarthric. There is a very mild right central facial droop without any other abnormal cranial nerves. There is very mild weakness of the right arm which is 4/5 strength. She would not cooperate with pronator drift testing and she will not cooperate with leg strength testing. There is no gross difference in leg strength. Deep tendon reflexes are symmetric. No Babinski response is seen.  ED Course  Procedures (including critical care time)  Results for orders placed during the hospital encounter of 09/12/11  CBC      Component Value Range   WBC 9.5  4.0 - 10.5 (K/uL)   RBC 3.83 (*) 3.87 - 5.11 (MIL/uL)   Hemoglobin 12.1  12.0 - 15.0 (g/dL)   HCT 62.9  52.8 - 41.3 (%)   MCV 97.7  78.0 - 100.0 (fL)   MCH 31.6  26.0 - 34.0 (pg)   MCHC 32.4  30.0 - 36.0 (g/dL)   RDW 24.4  01.0 - 27.2 (%)   Platelets 220  150 -  400 (K/uL)  TROPONIN I      Component Value Range   Troponin I <0.30  <0.30 (ng/mL)  URINALYSIS, ROUTINE W REFLEX MICROSCOPIC      Component Value Range   Color, Urine YELLOW  YELLOW    Appearance TURBID (*) CLEAR    Specific Gravity, Urine 1.034 (*) 1.005 - 1.030    pH 5.5  5.0 - 8.0    Glucose, UA NEGATIVE  NEGATIVE (mg/dL)   Hgb urine dipstick MODERATE (*) NEGATIVE    Bilirubin Urine NEGATIVE  NEGATIVE    Ketones, ur 15 (*) NEGATIVE (mg/dL)   Protein, ur 161 (*) NEGATIVE (mg/dL)   Urobilinogen, UA 0.2  0.0 - 1.0 (mg/dL)   Nitrite POSITIVE (*) NEGATIVE    Leukocytes, UA LARGE (*) NEGATIVE   COMPREHENSIVE METABOLIC PANEL      Component Value  Range   Sodium 137  135 - 145 (mEq/L)   Potassium 3.9  3.5 - 5.1 (mEq/L)   Chloride 98  96 - 112 (mEq/L)   CO2 24  19 - 32 (mEq/L)   Glucose, Bld 135 (*) 70 - 99 (mg/dL)   BUN 19  6 - 23 (mg/dL)   Creatinine, Ser 0.96  0.50 - 1.10 (mg/dL)   Calcium 04.5  8.4 - 10.5 (mg/dL)   Total Protein 7.6  6.0 - 8.3 (g/dL)   Albumin 3.3 (*) 3.5 - 5.2 (g/dL)   AST 37  0 - 37 (U/L)   ALT 39 (*) 0 - 35 (U/L)   Alkaline Phosphatase 56  39 - 117 (U/L)   Total Bilirubin 0.4  0.3 - 1.2 (mg/dL)   GFR calc non Af Amer 46 (*) >90 (mL/min)   GFR calc Af Amer 54 (*) >90 (mL/min)  GLUCOSE, CAPILLARY      Component Value Range   Glucose-Capillary 131 (*) 70 - 99 (mg/dL)  URINE MICROSCOPIC-ADD ON      Component Value Range   Squamous Epithelial / LPF MANY (*) RARE    WBC, UA TOO NUMEROUS TO COUNT  <3 (WBC/hpf)   RBC / HPF 11-20  <3 (RBC/hpf)   Bacteria, UA MANY (*) RARE    Ct Head Wo Contrast  09/12/2011  *RADIOLOGY REPORT*  Clinical Data: 77 year old female with left side weakness, altered level of consciousness.  CT HEAD WITHOUT CONTRAST  Technique:  Contiguous axial images were obtained from the base of the skull through the vertex without contrast.  Comparison: None.  Findings: Left side and bilateral sphenoid paranasal sinus mucoperiosteal thickening with complete left side sinus opacification. There is also some opacification of the left mastoids.  The visualized nasopharynx is unremarkable.  The right mastoids and paranasal sinuses are better pneumatized. No acute osseous abnormality identified.  No acute orbit or scalp soft tissue findings.  Ventriculomegaly.  No evidence of transependymal edema.  There is generalized cerebral volume loss. No midline shift, mass effect, or evidence of mass lesion.  No acute intracranial hemorrhage identified.  No evidence of cortically based acute infarction identified.  No suspicious intracranial vascular hyperdensity.  IMPRESSION: 1.  Ventriculomegaly without evidence of  acute hydrocephalus could be related to normal pressure hydrocephalus and/or cerebral volume loss. 2.  No evidence of acute cortically based infarction or other acute intracranial abnormality. 3.  Chronic paranasal sinusitis.  Original Report Authenticated By: Harley Hallmark, M.D.   Dg Chest Portable 1 View  09/12/2011  *RADIOLOGY REPORT*  Clinical Data: Weakness and lethargy today.  PORTABLE CHEST - 1 VIEW  Comparison: 07/16/2006  Findings: Heart size and vascularity are normal and the lungs are clear.  No osseous abnormality.  Tortuosity and calcification of the thoracic aorta.  IMPRESSION: No acute abnormalities.  Original Report Authenticated By: Gwynn Burly, M.D.      Date: 09/12/2011  Rate: 101  Rhythm: sinus tachycardia  QRS Axis: normal  Intervals: normal  ST/T Wave abnormalities: nonspecific ST/T changes  Conduction Disutrbances:none  Narrative Interpretation: Inferior wall Q waves which are new, low voltage in the chest leads along with poor R-wave progression which is also new. Changes are compared with ECG of 07/12/2006  Old EKG Reviewed: changes noted Urinalysis shows UTI, since you started on IV Rocephin. CT scan showed atrophy but no evidence of bleed. 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 She will need to be admitted for neurologic observation as well as starting her on IV antibiotics. Consultation has been obtained with Dr. Toniann Fail who agrees to admit the patient. Findings were discussed with a family as well as the rationale for not treating with thrombolytics.   MDM  Patient is not a code stroke based on uncertain time of onset. She is not a candidate for thrombolytic therapy based on very mild deficit, uncertain time of onset, and also the patient is DO NOT RESUSCITATE.        Dione Booze, MD 09/12/11 [phone]

## 2011-09-12 NOTE — ED Notes (Signed)
Family at bedside. 

## 2011-09-12 NOTE — ED Notes (Signed)
cbg 179 per ems

## 2011-09-13 ENCOUNTER — Encounter (HOSPITAL_COMMUNITY): Payer: Self-pay | Admitting: Internal Medicine

## 2011-09-13 ENCOUNTER — Inpatient Hospital Stay (HOSPITAL_COMMUNITY): Payer: Medicare Other

## 2011-09-13 DIAGNOSIS — E1122 Type 2 diabetes mellitus with diabetic chronic kidney disease: Secondary | ICD-10-CM | POA: Diagnosis present

## 2011-09-13 DIAGNOSIS — N390 Urinary tract infection, site not specified: Secondary | ICD-10-CM | POA: Diagnosis present

## 2011-09-13 DIAGNOSIS — G8191 Hemiplegia, unspecified affecting right dominant side: Secondary | ICD-10-CM | POA: Diagnosis present

## 2011-09-13 DIAGNOSIS — F0390 Unspecified dementia without behavioral disturbance: Secondary | ICD-10-CM | POA: Diagnosis present

## 2011-09-13 LAB — COMPREHENSIVE METABOLIC PANEL
AST: 25 U/L (ref 0–37)
BUN: 16 mg/dL (ref 6–23)
CO2: 30 mEq/L (ref 19–32)
Chloride: 98 mEq/L (ref 96–112)
Creatinine, Ser: 1.02 mg/dL (ref 0.50–1.10)
GFR calc Af Amer: 59 mL/min — ABNORMAL LOW (ref >90)
GFR calc non Af Amer: 51 mL/min — ABNORMAL LOW (ref >90)
Glucose, Bld: 167 mg/dL — ABNORMAL HIGH (ref 70–99)
Total Bilirubin: 0.4 mg/dL (ref 0.3–1.2)

## 2011-09-13 LAB — TSH: TSH: 1.659 u[IU]/mL (ref 0.350–4.500)

## 2011-09-13 LAB — HEMOGLOBIN A1C: Mean Plasma Glucose: 134 mg/dL — ABNORMAL HIGH (ref <117)

## 2011-09-13 LAB — CBC
MCH: 31.4 pg (ref 26.0–34.0)
MCHC: 31.8 g/dL (ref 30.0–36.0)
Platelets: 187 10*3/uL (ref 150–400)
RBC: 3.6 MIL/uL — ABNORMAL LOW (ref 3.87–5.11)

## 2011-09-13 LAB — MRSA PCR SCREENING: MRSA by PCR: NEGATIVE

## 2011-09-13 MED ORDER — CEFTRIAXONE SODIUM 1 G IJ SOLR
1.0000 g | INTRAMUSCULAR | Status: DC
  Administered 2011-09-14 (×2): 1 g via INTRAVENOUS
  Filled 2011-09-13 (×3): qty 10

## 2011-09-13 MED ORDER — LEVOTHYROXINE SODIUM 100 MCG PO TABS
100.0000 ug | ORAL_TABLET | Freq: Every day | ORAL | Status: DC
  Administered 2011-09-14 – 2011-09-15 (×2): 100 ug via ORAL
  Filled 2011-09-13 (×4): qty 1

## 2011-09-13 MED ORDER — LISINOPRIL 5 MG PO TABS
5.0000 mg | ORAL_TABLET | Freq: Every day | ORAL | Status: DC
  Administered 2011-09-13 – 2011-09-15 (×3): 5 mg via ORAL
  Filled 2011-09-13 (×3): qty 1

## 2011-09-13 MED ORDER — ENOXAPARIN SODIUM 40 MG/0.4ML ~~LOC~~ SOLN
40.0000 mg | SUBCUTANEOUS | Status: DC
  Administered 2011-09-13 – 2011-09-14 (×2): 40 mg via SUBCUTANEOUS
  Filled 2011-09-13 (×3): qty 0.4

## 2011-09-13 MED ORDER — CHLORHEXIDINE GLUCONATE 0.12 % MT SOLN
15.0000 mL | Freq: Two times a day (BID) | OROMUCOSAL | Status: DC
  Administered 2011-09-13 – 2011-09-15 (×4): 15 mL via OROMUCOSAL
  Filled 2011-09-13 (×6): qty 15

## 2011-09-13 MED ORDER — CLOPIDOGREL BISULFATE 75 MG PO TABS
75.0000 mg | ORAL_TABLET | Freq: Every day | ORAL | Status: DC
  Administered 2011-09-13 – 2011-09-15 (×3): 75 mg via ORAL
  Filled 2011-09-13 (×5): qty 1

## 2011-09-13 MED ORDER — INSULIN GLARGINE 100 UNIT/ML ~~LOC~~ SOLN
12.0000 [IU] | Freq: Every day | SUBCUTANEOUS | Status: DC
Start: 2011-09-13 — End: 2011-09-15
  Administered 2011-09-13 – 2011-09-14 (×2): 12 [IU] via SUBCUTANEOUS
  Filled 2011-09-13: qty 3

## 2011-09-13 MED ORDER — SIMVASTATIN 20 MG PO TABS
20.0000 mg | ORAL_TABLET | Freq: Every day | ORAL | Status: DC
  Administered 2011-09-13 – 2011-09-14 (×2): 20 mg via ORAL
  Filled 2011-09-13 (×3): qty 1

## 2011-09-13 MED ORDER — CALCIUM CARBONATE-VITAMIN D 500-200 MG-UNIT PO TABS
1.0000 | ORAL_TABLET | Freq: Two times a day (BID) | ORAL | Status: DC
  Administered 2011-09-13 (×2): 1 via ORAL
  Administered 2011-09-14: 23:00:00 via ORAL
  Administered 2011-09-14 – 2011-09-15 (×2): 1 via ORAL
  Filled 2011-09-13 (×7): qty 1

## 2011-09-13 MED ORDER — SENNOSIDES-DOCUSATE SODIUM 8.6-50 MG PO TABS: 1.0000 | ORAL_TABLET | Freq: Every evening | ORAL | Status: DC | PRN

## 2011-09-13 MED ORDER — POLYETHYLENE GLYCOL 3350 17 G PO PACK
17.0000 g | PACK | Freq: Every day | ORAL | Status: DC
  Administered 2011-09-13 – 2011-09-15 (×3): 17 g via ORAL
  Filled 2011-09-13 (×3): qty 1

## 2011-09-13 MED ORDER — SODIUM CHLORIDE 0.9 % IV SOLN
INTRAVENOUS | Status: DC
  Administered 2011-09-13: 1000 mL via INTRAVENOUS

## 2011-09-13 MED ORDER — ACETAMINOPHEN 500 MG PO TABS
1000.0000 mg | ORAL_TABLET | Freq: Two times a day (BID) | ORAL | Status: DC
  Administered 2011-09-13 – 2011-09-15 (×5): 1000 mg via ORAL
  Filled 2011-09-13 (×7): qty 2

## 2011-09-13 MED ORDER — INSULIN ASPART 100 UNIT/ML ~~LOC~~ SOLN
0.0000 [IU] | Freq: Three times a day (TID) | SUBCUTANEOUS | Status: DC
  Administered 2011-09-13 – 2011-09-15 (×3): 2 [IU] via SUBCUTANEOUS
  Filled 2011-09-13: qty 3

## 2011-09-13 MED ORDER — TRAMADOL HCL 50 MG PO TABS
50.0000 mg | ORAL_TABLET | Freq: Four times a day (QID) | ORAL | Status: DC | PRN
  Filled 2011-09-13: qty 1

## 2011-09-13 MED ORDER — TRAMADOL HCL 50 MG PO TABS
50.0000 mg | ORAL_TABLET | Freq: Every day | ORAL | Status: DC
  Administered 2011-09-13 – 2011-09-15 (×3): 50 mg via ORAL
  Filled 2011-09-13 (×3): qty 1

## 2011-09-13 MED ORDER — HYDROCORTISONE 2.5 % RE CREA
1.0000 "application " | TOPICAL_CREAM | RECTAL | Status: DC | PRN
  Filled 2011-09-13: qty 28.35

## 2011-09-13 MED ORDER — DONEPEZIL HCL 5 MG PO TABS
5.0000 mg | ORAL_TABLET | Freq: Every day | ORAL | Status: DC
  Administered 2011-09-13 – 2011-09-14 (×2): 5 mg via ORAL
  Filled 2011-09-13 (×3): qty 1

## 2011-09-13 MED ORDER — MEMANTINE HCL 10 MG PO TABS
10.0000 mg | ORAL_TABLET | Freq: Two times a day (BID) | ORAL | Status: DC
  Administered 2011-09-13 – 2011-09-15 (×5): 10 mg via ORAL
  Filled 2011-09-13 (×6): qty 1

## 2011-09-13 MED ORDER — ASPIRIN 325 MG PO TABS: 325.0000 mg | ORAL_TABLET | Freq: Every day | ORAL | Status: DC

## 2011-09-13 MED ORDER — ASPIRIN 300 MG RE SUPP
300.0000 mg | Freq: Every day | RECTAL | Status: DC
  Administered 2011-09-13: 300 mg via RECTAL
  Filled 2011-09-13: qty 1

## 2011-09-13 MED ORDER — DOCUSATE SODIUM 100 MG PO CAPS
100.0000 mg | ORAL_CAPSULE | Freq: Every day | ORAL | Status: DC
  Administered 2011-09-13 – 2011-09-14 (×2): 100 mg via ORAL
  Filled 2011-09-13 (×2): qty 1

## 2011-09-13 NOTE — Progress Notes (Signed)
PT Cancellation Note  Treatment cancelled today due to pt on bedrest and noted PT order to begin 09/14/11.    09/13/2011 Veda Canning, PT Pager: [phone]

## 2011-09-13 NOTE — ED Notes (Signed)
Patient is resting with eyes closed. No family around. VSS in monitor.

## 2011-09-13 NOTE — Progress Notes (Signed)
CSW completed assessment and FL2 and placed in shadow chart. Patient from [facility] and can return at dc.

## 2011-09-13 NOTE — Progress Notes (Signed)
  Echocardiogram 2D Echocardiogram has been performed.  Juanita Laster Sharnika Binney, RDCS 09/13/2011, 8:39 AM

## 2011-09-13 NOTE — Progress Notes (Signed)
Utilization Review Completed.  Kesleigh Morson T  09/13/2011 

## 2011-09-13 NOTE — Progress Notes (Signed)
Speech Language/Pathology Clinical/Bedside Swallow Evaluation Patient Details  Name: [name] MRN: [id number] DOB: 1929/09/23 Today's Date: 09/13/2011  Note: Pt also to receive cognitive linguistic eval as part of the stroke protocol. Given pts dementia and report that she is currently at baseline will defer an evaluation.  Please reorder if desired.  See below for results of swallow eval.   Assessment/Recommendations/Treatment Plan  SLP Assessment Clinical Impression Statement: Pt with no overt s/s of aspiration. Factors implying aspiration risk include dementia and dependent feeding. The swallow response was mildly delayed with thin liquids and the pt was observed to have small belches after sips of water which occasionally eliceted a second swallow.  The pt may have some mild backflow from the esophagus, which she appears to sense. The pt is unable to consume solids due to mentation. Will recommend a puree, thin liquid diet with aspiration and esophageal precaustions with mild aspiration risk.  Will follow for tolerance.  Risk for Aspiration: Mild Other Related Risk Factors: Cognitive impairment  Recommendations Solid Consistency: Dysphagia 1 (Puree) Liquid Consistency: Thin Liquid Administration via: Cup Medication Administration: Whole meds with puree Supervision: Staff feed patient Postural Changes and/or Swallow Maneuvers: Seated upright 90 degrees;Upright 30-60 min after meal Oral Care Recommendations: Oral care BID  Prognosis Prognosis for Safe Diet Advancement: Fair Barriers to Reach Goals: Cognitive deficits  Individuals Consulted Consulted and Agree with Results and Recommendations: Patient unable/family or caregiver not available  Swallow Study Goals  SLP Swallowing Goals Patient will consume recommended diet without observed clinical signs of aspiration with: Total assistance Swallow Study Goal #1 - Progress: Progressing toward goal Patient will utilize  recommended strategies during swallow to increase swallowing safety with: Total assistance Swallow Study Goal #2 - Progress: Progressing toward goal   Claudine Mouton [phone] 09/13/2011,9:32 AM

## 2011-09-13 NOTE — Progress Notes (Signed)
*  PRELIMINARY RESULTS*  Carotid Dopplers have been completed.  There is no significant ICA stenosis.  Vertebral artery flow is antegrade.    Sherren Kerns Renee 09/13/2011, 8:27 AM

## 2011-09-13 NOTE — Progress Notes (Signed)
Subjective: AAOX1; no CP, no SOB, no Fever. Daughter at bedside.  Objective: Vital signs in last 24 hours: Temp:  [97.6 F (36.4 C)-98.9 F (37.2 C)] 97.6 F (36.4 C) (11/12 1000) Pulse Rate:  [91-106] 91  (11/12 1000) Resp:  [16-23] 20  (11/12 1000) BP: (115-150)/(55-98) 148/72 mmHg (11/12 1000) SpO2:  [92 %-100 %] 100 % (11/12 0602) Weight:  [88.1 kg (194 lb 3.6 oz)] 194 lb 3.6 oz (88.1 kg) (11/12 0451) Weight change:     Intake/Output from previous day: 11/11 0701 - 11/12 0700 In: 43.3 [I.V.:43.3] Out: -      Physical Exam: General: Alert, awake, oriented x1, in no acute distress. HEENT: No bruits, no goiter. Heart: Regular rate and rhythm, + systolic murmur, Negative rubs or gallops. Lungs: Clear to auscultation bilaterally. Abdomen: Soft, nontender, nondistended, positive bowel sounds. Extremities: No clubbing cyanosis or edema with positive pedal pulses. Neuro: RUE with ms 2/5 and some neglection; LUE ms was 4/5; no nystagmus; bilateral lower extremities strength was 2-3/5; rest of neurologic exam difficult to assess or interpretate due to underlying dementia.   Lab Results: Basic Metabolic Panel:  [facility] 09/13/11 0935 09/12/11 1854  NA 139 137  K 4.2 3.9  CL 98 98  CO2 30 24  GLUCOSE 167* 135*  BUN 16 19  CREATININE 1.02 1.10  CALCIUM 9.7 10.0  MG -- --  PHOS -- --   Liver Function Tests:  [facility] 09/13/11 0935 09/12/11 1854  AST 25 37  ALT 34 39*  ALKPHOS 63 56  BILITOT 0.4 0.4  PROT 8.3 7.6  ALBUMIN 3.4* 3.3*   CBC:  [facility] 09/13/11 0935 09/12/11 1854  WBC 11.2* 9.5  NEUTROABS -- --  HGB 11.3* 12.1  HCT 35.5* 37.4  MCV 98.6 97.7  PLT 187 220   Cardiac Enzymes:  Basename 09/12/11 1900  CKTOTAL --  CKMB --  CKMBINDEX --  TROPONINI <0.30   CBG:  Basename 09/13/11 0646 09/12/11 1904  GLUCAP 170* 131*    Misc. Labs:  Recent Results (from the past 240 hour(s))  MRSA PCR SCREENING     Status: Normal   Collection Time   09/13/11  5:32 AM      Component Value Range Status Comment   MRSA by PCR NEGATIVE  NEGATIVE  Final     Studies/Results: Dg Chest 2 View  09/13/2011  *RADIOLOGY REPORT*  Clinical Data: Stroke. Shortness of breath.  Diabetes and hypertension.  CHEST - 2 VIEW  Comparison: 09/12/2011  Findings: Low lung volumes are again seen, however both lungs are clear.  Heart size is stable.  Tortuosity of the thoracic aorta is unchanged.  IMPRESSION: Stable low lung volumes.  No acute findings.  Original Report Authenticated By: Danae Orleans, M.D.   Ct Head Wo Contrast  09/12/2011  *RADIOLOGY REPORT*  Clinical Data: 77 year old female with left side weakness, altered level of consciousness.  CT HEAD WITHOUT CONTRAST  Technique:  Contiguous axial images were obtained from the base of the skull through the vertex without contrast.  Comparison: None.  Findings: Left side and bilateral sphenoid paranasal sinus mucoperiosteal thickening with complete left side sinus opacification. There is also some opacification of the left mastoids.  The visualized nasopharynx is unremarkable.  The right mastoids and paranasal sinuses are better pneumatized. No acute osseous abnormality identified.  No acute orbit or scalp soft tissue findings.  Ventriculomegaly.  No evidence of transependymal edema.  There is generalized cerebral volume loss. No midline shift, mass effect,  or evidence of mass lesion.  No acute intracranial hemorrhage identified.  No evidence of cortically based acute infarction identified.  No suspicious intracranial vascular hyperdensity.  IMPRESSION: 1.  Ventriculomegaly without evidence of acute hydrocephalus could be related to normal pressure hydrocephalus and/or cerebral volume loss. 2.  No evidence of acute cortically based infarction or other acute intracranial abnormality. 3.  Chronic paranasal sinusitis.  Original Report Authenticated By: Harley Hallmark, M.D.   Dg Chest Portable 1 View  09/12/2011   *RADIOLOGY REPORT*  Clinical Data: Weakness and lethargy today.  PORTABLE CHEST - 1 VIEW  Comparison: 07/16/2006  Findings: Heart size and vascularity are normal and the lungs are clear.  No osseous abnormality.  Tortuosity and calcification of the thoracic aorta.  IMPRESSION: No acute abnormalities.  Original Report Authenticated By: Gwynn Burly, M.D.    Medications: Scheduled Meds:   . acetaminophen  1,000 mg Oral BID  . calcium-vitamin D  1 tablet Oral BID  . cefTRIAXone (ROCEPHIN) IV  1 g Intravenous Once  . cefTRIAXone (ROCEPHIN) IV  1 g Intravenous Q24H  . chlorhexidine  15 mL Mouth/Throat BID  . clopidogrel  75 mg Oral Q breakfast  . docusate sodium  100 mg Oral QHS  . donepezil  5 mg Oral QHS  . insulin aspart  0-9 Units Subcutaneous TID WC  . insulin glargine  12 Units Subcutaneous QHS  . levothyroxine  100 mcg Oral QAC breakfast  . lisinopril  5 mg Oral Daily  . memantine  10 mg Oral BID  . polyethylene glycol  17 g Oral Daily  . simvastatin  20 mg Oral QHS  . traMADol  50 mg Oral Daily  . DISCONTD: aspirin  300 mg Rectal Daily  . DISCONTD: aspirin  325 mg Oral Daily   Continuous Infusions:   . DISCONTD: sodium chloride 1,000 mL (09/13/11 0518)   PRN Meds:.hydrocortisone, senna-docusate, traMADol  Assessment/Plan: 1-Hemiparesis, right: most likely 2/2 to stroke; especially with no resolution of symptoms by this time. MRI and MRA are pending. This event happens while patient was on ASA, so will start plavix instead at this time. Continue stroke workup. 2-UTI (lower urinary tract infection): cx pending; continue rocephin. 3-Dementia: no behavioral problems associated with her dementia; continue supportive care and continue memantine.Marland Kitchen 4-Diabetes mellitus: Follow TSH and continue SSI and lantux. 5-HLd: continue zocor. 6-Chronic compensated diastolic CHF: EF 11% and findings suggesting grade 2 diastolic dysfunction. Continue current medication regimen and also low  sodium diet. 7-HTN: continue lisinopril.; stable. 8-DVT: Lovenox.    LOS: 1 day   Conard Alvira 09/13/2011, 2:10 PM

## 2011-09-13 NOTE — H&P (Signed)
[name] is an 77 y.o. female.   Chief Complaint: Right sided weakness HPI: 77 year old female with H/O dementia, diabetes mellitus type 2, was brought into ER after patient was found to have right sided weakness with some right facial droop. The time onset is not clear thus patient is not a candidate for TPA. In the ER patient had CT Head which does not show anything acute. In addition her urine shows features of UTI. I did discuss with patient's daughter. And as per patient's daughter patient is usually wheelchair bound and has dementia and only recogonises her. There was no other complaints. By the time I examined patient is moving her extremities and I don't see any obvious facial droop. Patient's examination is limited by her non cooperation due to her dementia.  Past Medical History  Diagnosis Date  . Hypertension   . Diabetes mellitus   . Thyroid disease   . DEMENTIA     Past Surgical History  Procedure Date  . Abdominal hysterectomy   . Thyroidectomy     Family History  Problem Relation Age of Onset  . Diabetes type II Other    Social History:  has an unknown smoking status. She does not have any smokeless tobacco history on file. She reports that she does not drink alcohol or use illicit drugs.  Allergies:  Allergies  Allergen Reactions  . Roxicet (Oxycodone-Acetaminophen) Other (See Comments)    Per medical record- unknown reaction    Medications Prior to Admission  Medication Dose Route Frequency Provider Last Rate Last Dose  . cefTRIAXone (ROCEPHIN) 1 g in dextrose 5 % 50 mL IVPB  1 g Intravenous Once Dione Booze, MD       No current outpatient prescriptions on file as of 09/12/2011.    Results for orders placed during the hospital encounter of 09/12/11 (from the past 48 hour(s))  CBC     Status: Abnormal   Collection Time   09/12/11  6:54 PM      Component Value Range Comment   WBC 9.5  4.0 - 10.5 (K/uL)    RBC 3.83 (*) 3.87 - 5.11 (MIL/uL)    Hemoglobin  12.1  12.0 - 15.0 (g/dL)    HCT 16.1  09.6 - 04.5 (%)    MCV 97.7  78.0 - 100.0 (fL)    MCH 31.6  26.0 - 34.0 (pg)    MCHC 32.4  30.0 - 36.0 (g/dL)    RDW 40.9  81.1 - 91.4 (%)    Platelets 220  150 - 400 (K/uL)   COMPREHENSIVE METABOLIC PANEL     Status: Abnormal   Collection Time   09/12/11  6:54 PM      Component Value Range Comment   Sodium 137  135 - 145 (mEq/L)    Potassium 3.9  3.5 - 5.1 (mEq/L)    Chloride 98  96 - 112 (mEq/L)    CO2 24  19 - 32 (mEq/L)    Glucose, Bld 135 (*) 70 - 99 (mg/dL)    BUN 19  6 - 23 (mg/dL)    Creatinine, Ser 7.82  0.50 - 1.10 (mg/dL)    Calcium 95.6  8.4 - 10.5 (mg/dL)    Total Protein 7.6  6.0 - 8.3 (g/dL)    Albumin 3.3 (*) 3.5 - 5.2 (g/dL)    AST 37  0 - 37 (U/L)    ALT 39 (*) 0 - 35 (U/L)    Alkaline Phosphatase 56  39 -  117 (U/L)    Total Bilirubin 0.4  0.3 - 1.2 (mg/dL)    GFR calc non Af Amer 46 (*) >90 (mL/min)    GFR calc Af Amer 54 (*) >90 (mL/min)   TROPONIN I     Status: Normal   Collection Time   09/12/11  7:00 PM      Component Value Range Comment   Troponin I <0.30  <0.30 (ng/mL)   GLUCOSE, CAPILLARY     Status: Abnormal   Collection Time   09/12/11  7:04 PM      Component Value Range Comment   Glucose-Capillary 131 (*) 70 - 99 (mg/dL)   URINALYSIS, ROUTINE W REFLEX MICROSCOPIC     Status: Abnormal   Collection Time   09/12/11  8:40 PM      Component Value Range Comment   Color, Urine YELLOW  YELLOW     Appearance TURBID (*) CLEAR     Specific Gravity, Urine 1.034 (*) 1.005 - 1.030     pH 5.5  5.0 - 8.0     Glucose, UA NEGATIVE  NEGATIVE (mg/dL)    Hgb urine dipstick MODERATE (*) NEGATIVE     Bilirubin Urine NEGATIVE  NEGATIVE     Ketones, ur 15 (*) NEGATIVE (mg/dL)    Protein, ur 540 (*) NEGATIVE (mg/dL)    Urobilinogen, UA 0.2  0.0 - 1.0 (mg/dL)    Nitrite POSITIVE (*) NEGATIVE     Leukocytes, UA LARGE (*) NEGATIVE    URINE MICROSCOPIC-ADD ON     Status: Abnormal   Collection Time   09/12/11  8:40 PM       Component Value Range Comment   Squamous Epithelial / LPF MANY (*) RARE     WBC, UA TOO NUMEROUS TO COUNT  <3 (WBC/hpf)    RBC / HPF 11-20  <3 (RBC/hpf)    Bacteria, UA MANY (*) RARE     Ct Head Wo Contrast  09/12/2011  *RADIOLOGY REPORT*  Clinical Data: 77 year old female with left side weakness, altered level of consciousness.  CT HEAD WITHOUT CONTRAST  Technique:  Contiguous axial images were obtained from the base of the skull through the vertex without contrast.  Comparison: None.  Findings: Left side and bilateral sphenoid paranasal sinus mucoperiosteal thickening with complete left side sinus opacification. There is also some opacification of the left mastoids.  The visualized nasopharynx is unremarkable.  The right mastoids and paranasal sinuses are better pneumatized. No acute osseous abnormality identified.  No acute orbit or scalp soft tissue findings.  Ventriculomegaly.  No evidence of transependymal edema.  There is generalized cerebral volume loss. No midline shift, mass effect, or evidence of mass lesion.  No acute intracranial hemorrhage identified.  No evidence of cortically based acute infarction identified.  No suspicious intracranial vascular hyperdensity.  IMPRESSION: 1.  Ventriculomegaly without evidence of acute hydrocephalus could be related to normal pressure hydrocephalus and/or cerebral volume loss. 2.  No evidence of acute cortically based infarction or other acute intracranial abnormality. 3.  Chronic paranasal sinusitis.  Original Report Authenticated By: Harley Hallmark, M.D.   Dg Chest Portable 1 View  09/12/2011  *RADIOLOGY REPORT*  Clinical Data: Weakness and lethargy today.  PORTABLE CHEST - 1 VIEW  Comparison: 07/16/2006  Findings: Heart size and vascularity are normal and the lungs are clear.  No osseous abnormality.  Tortuosity and calcification of the thoracic aorta.  IMPRESSION: No acute abnormalities.  Original Report Authenticated By: Gwynn Burly, M.D.  Review of Systems  Constitutional: Negative.   HENT: Negative.   Eyes: Negative.   Respiratory: Negative.   Gastrointestinal: Negative.   Genitourinary: Negative.   Musculoskeletal: Negative.   Skin: Negative.   Neurological: Positive for focal weakness.  Endo/Heme/Allergies: Negative.     Blood pressure 130/63, pulse 100, temperature 97.8 F (36.6 C), temperature source Oral, resp. rate 16, SpO2 99.00%. Physical Exam  Constitutional: She appears well-developed and well-nourished.  HENT:  Head: Normocephalic and atraumatic.  Eyes: Pupils are equal, round, and reactive to light.       Patient is not very cooperative and resist me in trying to examine her eyes.  Neck: Normal range of motion. Neck supple.  Cardiovascular: Normal rate and regular rhythm.   Respiratory: Effort normal and breath sounds normal.  GI: Soft. Bowel sounds are normal.  Musculoskeletal: Normal range of motion.  Neurological:       Patient is alert. Responds to her name. She is oriented to her name. At this time I don't see any obvious facial drrop. Patient is moving all extremities. Further neurological exam is difficult.  Skin: Skin is warm and dry.     Assessment/Plan 1)Possible CVA vs TIA - will place patient on neuro checks. Get swallow evaluation. Obtain MRI/MRA Brain, carotid doppler and 2D Echo 2)UTI - Get urine cultures, continue ceftriaxone. 3)Diabetes mellitus - continue Lantus and place on sliding scale coverage.Hold metformin for now. 4)Dementia - continue her present medications 5)Hypothyroidism - check TSH  Darreon Lutes N. 09/13/2011, 12:39 AM

## 2011-09-14 ENCOUNTER — Inpatient Hospital Stay (HOSPITAL_COMMUNITY): Payer: Medicare Other

## 2011-09-14 LAB — LIPID PANEL
Cholesterol: 142 mg/dL (ref 0–200)
Total CHOL/HDL Ratio: 3.2 RATIO
Triglycerides: 150 mg/dL — ABNORMAL HIGH (ref <150)
VLDL: 30 mg/dL (ref 0–40)

## 2011-09-14 LAB — GLUCOSE, CAPILLARY
Glucose-Capillary: 184 mg/dL — ABNORMAL HIGH (ref 70–99)
Glucose-Capillary: 167 mg/dL — ABNORMAL HIGH (ref 70–99)

## 2011-09-14 LAB — VITAMIN B12: Vitamin B-12: 671 pg/mL (ref 211–911)

## 2011-09-14 MED ORDER — GLUCERNA SHAKE PO LIQD
237.0000 mL | Freq: Every day | ORAL | Status: DC
  Administered 2011-09-14 – 2011-09-15 (×2): 237 mL via ORAL

## 2011-09-14 NOTE — Progress Notes (Signed)
Physical Therapy Evaluation Patient Details Name: [name] MRN: [id number] DOB: [birth year] Today's Date: 09/14/2011  Problem List:  Patient Active Problem List  Diagnoses  . Hemiparesis, right  . UTI (lower urinary tract infection)  . Dementia  . Diabetes mellitus    Past Medical History:  Past Medical History  Diagnosis Date  . Hypertension   . Diabetes mellitus   . Thyroid disease   . DEMENTIA    Past Surgical History:  Past Surgical History  Procedure Date  . Abdominal hysterectomy   . Thyroidectomy     PT Assessment/Plan/Recommendation PT Assessment Clinical Impression Statement: Pt is 77 y.o. female s/p TIA with significant dementia at baseline. Pt presents with generalized weakness R>L, L gaze preference, and impaired cognition. PTA pt was at low level of mobiilty and cognition, requiring lift and WC for stransfers and mobility. Pt likeliy to return to PLOF and will benefit from acute PT to increase strength and I with bed mobility to decrease caregiver burden and increase actifvity tolerance.  PT Recommendation/Assessment: Patient will need skilled PT in the acute care venue PT Problem List: Decreased strength;Decreased range of motion;Decreased activity tolerance;Decreased mobility;Decreased cognition Barriers to Discharge: None PT Therapy Diagnosis : Hemiplegia dominant side PT Plan PT Frequency: Min 3X/week PT Treatment/Interventions: Functional mobility training;Therapeutic exercise;Neuromuscular re-education;Patient/family education PT Recommendation Follow Up Recommendations: Skilled nursing facility;24 hour supervision/assistance Equipment Recommended: Defer to next venue PT Goals  Acute Rehab PT Goals PT Goal Formulation: With patient/family Time For Goal Achievement: 4 days Pt will go Supine/Side to Sit: with mod assist;with cues (comment type and amount) (Cues for initiation and technique) PT Goal: Supine/Side to Sit - Progress: Progressing toward  goal Pt will Sit at [facility] of Bed: with min assist;6-10 min;with bilateral upper extremity support;with cues (comment type and amount) (Cues for achieving midline and reachign in/outside BOS) PT Goal: Sit at Edge Of Bed - Progress: Progressing toward goal Pt will go Sit to Supine/Side: with mod assist;with cues (comment type and amount) (Cues for initiation and technique) PT Goal: Sit to Supine/Side - Progress: Progressing toward goal Additional Goals Additional Goal #1: Participate in strengthening ex in supine/sitting to increase LE strength for functional mobility PT Goal: Additional Goal #1 - Progress: Progressing toward goal  PT Evaluation Precautions/Restrictions    Prior Functioning  Home Living Type of Home: Skilled Nursing Facility Prior Function Level of Independence: Needs assistance with ADLs;Needs assistance with tranfers (PTA pt transferes with lift and is WC bound) Cognition Cognition Arousal/Alertness: Awake/alert Overall Cognitive Status: History of cognitive impairments History of Cognitive Impairment: Appears at baseline functioning Cognition - Other Comments: Pt able to answer yes/no questions, but response with inapproapriate words Sensation/Coordination Sensation Light Touch: Appears Intact Stereognosis: Not tested Hot/Cold: Not tested Proprioception: Not tested Extremity Assessment RLE Assessment RLE Assessment: Exceptions to [facility] RLE Strength RLE Overall Strength: Deficits RLE Overall Strength Comments: Grossly 2/5 throughout hips/knees, 1/5 DF LLE Assessment LLE Assessment: Exceptions to [facility] LLE Strength LLE Overall Strength: Deficits LLE Overall Strength Comments: Grossly 2+/5 thoughout  Mobility (including Balance) Bed Mobility Bed Mobility: Yes Supine to Sit: 1: +1 Total assist;Patient percentage (comment);HOB elevated (Comment degrees) (Pt 10% with UE's, HOB 40deg) Supine to Sit Details (indicate cue type and reason): Cues for initiation,  sequencing and technique Sit to Supine - Right: 1: +1 Total assist;Patient percentage (comment);HOB flat (Pt 20% with UEs and trunk) Sit to Supine - Right Details (indicate cue type and reason): Cues for initiation and technique Transfers Transfers: No  Posture/Postural Control Posture/Postural Control: Postural limitations Postural Limitations: Pt leans to L and posteriorly, unable to follow cues for midline or reaching with R UE to sit upright. L gaze preference. Sat EOB x4 min with Mod A for trunk.  Exercise    End of Session PT - End of Session Activity Tolerance: Patient tolerated treatment well (This is the first time pt has been able to be aroused today) Patient left: in bed;with family/visitor present (Transporter arrived to take pt to MRI) Nurse Communication: Mobility status for transfers;Need for lift equipment General Behavior During Session: [facility] for tasks performed Cognition: Impaired, at baseline  [facility] 09/14/2011, 10:46 AM

## 2011-09-14 NOTE — Progress Notes (Signed)
Per MD, patient not ready to dc today but likely tomorrow. CSW called SNF and left a message updating facility on dc plans. CSW will continue to follow to assist with dc needs.

## 2011-09-14 NOTE — Progress Notes (Signed)
Speech Pathology: Dysphagia Treatment Note  Patient was observed with : Regular and Thin liquids.  Patient was noted to have s/s of aspiration : No:    Lung Sounds: clear  Temperature: afebrile   Patient required: Min assistance to initiate PO intake, but functionally masticated cracker with independent lingual sweeps to clear buccal residue as well as timely swallow response with thin and solids.   Other: Pt's daughters were at bedside and state that pt was on a regular diet prior to admit.    Recommendations:  Pt much more alert today and agreeing that she would like a [employer] sandwich.  Pt is safe to initiate a regular thin diet with aspiration precautions.  SLP provided education regarding upright posture, oral care, signs and symptoms of aspiration as well as long term planning for feeding given pts dementia.  No f/u for diet tolerance needed at this time.     Pain:   none Intervention Required:   No   Goals: All Goals Met  Harlon Ditty, MA CCC-SLP [phone]

## 2011-09-14 NOTE — Progress Notes (Signed)
Noted patient from SNF and to return to SNF. Also, noted this likely to occur tomorrow. Will defer OT eval to SNF, acute OT signing off.  Thank you,  [place]end[place] Chard    OTR/L Pager: [phone] 09/14/2011

## 2011-09-14 NOTE — Progress Notes (Signed)
Subjective: AAOX1; no CP, no SOB, no Fever. Daughter at bedside. Patient doing ok.  Objective: Vital signs in last 24 hours: Temp:  [97.4 F (36.3 C)-99.7 F (37.6 C)] 98.3 F (36.8 C) (11/13 1041) Pulse Rate:  [67-97] 73  (11/13 1041) Resp:  [16-19] 18  (11/13 1041) BP: (88-119)/(51-71) 117/71 mmHg (11/13 1041) SpO2:  [93 %-99 %] 97 % (11/13 1041) Weight change:      Physical Exam: General: Alert, awake, oriented x1, in no acute distress. HEENT: No bruits, no goiter. Heart: Regular rate and rhythm, + systolic murmur, Negative rubs or gallops. Lungs: Clear to auscultation bilaterally. Abdomen: Soft, nontender, nondistended, positive bowel sounds. Extremities: No clubbing, cyanosis and with positive pedal pulses. Trace edema bilaterally. Neuro: RUE with ms 3/5 and some neglection; LUE ms was 4/5; no nystagmus; bilateral lower extremities strength was 2-3/5; rest of neurologic exam difficult to assess or interpretate due to underlying dementia.   Lab Results: Basic Metabolic Panel:  [facility] 09/13/11 0935 09/12/11 1854  NA 139 137  K 4.2 3.9  CL 98 98  CO2 30 24  GLUCOSE 167* 135*  BUN 16 19  CREATININE 1.02 1.10  CALCIUM 9.7 10.0  MG -- --  PHOS -- --   Liver Function Tests:  [facility] 09/13/11 0935 09/12/11 1854  AST 25 37  ALT 34 39*  ALKPHOS 63 56  BILITOT 0.4 0.4  PROT 8.3 7.6  ALBUMIN 3.4* 3.3*   CBC:  [facility] 09/13/11 0935 09/12/11 1854  WBC 11.2* 9.5  NEUTROABS -- --  HGB 11.3* 12.1  HCT 35.5* 37.4  MCV 98.6 97.7  PLT 187 220   Cardiac Enzymes:  Basename 09/12/11 1900  CKTOTAL --  CKMB --  CKMBINDEX --  TROPONINI <0.30   CBG:  Basename 09/14/11 0023 09/13/11 1703 09/13/11 1141 09/13/11 0646 09/12/11 1904  GLUCAP 178* 126* 181* 170* 131*    Misc. Labs:  Recent Results (from the past 240 hour(s))  MRSA PCR SCREENING     Status: Normal   Collection Time   09/13/11  5:32 AM      Component Value Range Status Comment   MRSA by PCR  NEGATIVE  NEGATIVE  Final     Studies/Results: Dg Chest 2 View  09/13/2011  *RADIOLOGY REPORT*  Clinical Data: Stroke. Shortness of breath.  Diabetes and hypertension.  CHEST - 2 VIEW  Comparison: 09/12/2011  Findings: Low lung volumes are again seen, however both lungs are clear.  Heart size is stable.  Tortuosity of the thoracic aorta is unchanged.  IMPRESSION: Stable low lung volumes.  No acute findings.  Original Report Authenticated By: Danae Orleans, M.D.   Ct Head Wo Contrast  09/12/2011  *RADIOLOGY REPORT*  Clinical Data: 77 year old female with left side weakness, altered level of consciousness.  CT HEAD WITHOUT CONTRAST  Technique:  Contiguous axial images were obtained from the base of the skull through the vertex without contrast.  Comparison: None.  Findings: Left side and bilateral sphenoid paranasal sinus mucoperiosteal thickening with complete left side sinus opacification. There is also some opacification of the left mastoids.  The visualized nasopharynx is unremarkable.  The right mastoids and paranasal sinuses are better pneumatized. No acute osseous abnormality identified.  No acute orbit or scalp soft tissue findings.  Ventriculomegaly.  No evidence of transependymal edema.  There is generalized cerebral volume loss. No midline shift, mass effect, or evidence of mass lesion.  No acute intracranial hemorrhage identified.  No evidence of cortically based acute infarction  identified.  No suspicious intracranial vascular hyperdensity.  IMPRESSION: 1.  Ventriculomegaly without evidence of acute hydrocephalus could be related to normal pressure hydrocephalus and/or cerebral volume loss. 2.  No evidence of acute cortically based infarction or other acute intracranial abnormality. 3.  Chronic paranasal sinusitis.  Original Report Authenticated By: Harley Hallmark, M.D.   Dg Chest Portable 1 View  09/12/2011  *RADIOLOGY REPORT*  Clinical Data: Weakness and lethargy today.  PORTABLE CHEST - 1  VIEW  Comparison: 07/16/2006  Findings: Heart size and vascularity are normal and the lungs are clear.  No osseous abnormality.  Tortuosity and calcification of the thoracic aorta.  IMPRESSION: No acute abnormalities.  Original Report Authenticated By: Gwynn Burly, M.D.    Medications: Scheduled Meds:    . acetaminophen  1,000 mg Oral BID  . calcium-vitamin D  1 tablet Oral BID  . cefTRIAXone (ROCEPHIN) IV  1 g Intravenous Q24H  . chlorhexidine  15 mL Mouth/Throat BID  . clopidogrel  75 mg Oral Q breakfast  . docusate sodium  100 mg Oral QHS  . donepezil  5 mg Oral QHS  . enoxaparin (LOVENOX) injection  40 mg Subcutaneous Q24H  . insulin aspart  0-9 Units Subcutaneous TID WC  . insulin glargine  12 Units Subcutaneous QHS  . levothyroxine  100 mcg Oral QAC breakfast  . lisinopril  5 mg Oral Daily  . memantine  10 mg Oral BID  . polyethylene glycol  17 g Oral Daily  . simvastatin  20 mg Oral QHS  . traMADol  50 mg Oral Daily  . DISCONTD: aspirin  300 mg Rectal Daily  . DISCONTD: aspirin  325 mg Oral Daily   Continuous Infusions:    . DISCONTD: sodium chloride 1,000 mL (09/13/11 0518)   PRN Meds:.hydrocortisone, senna-docusate, traMADol  Assessment/Plan: 1-Hemiparesis, right: most likely 2/2 to stroke; MRI/MRA will be done today; will follow results. Continue plavix. 2-UTI (lower urinary tract infection): cx pending; continue rocephin. 3-Dementia: no behavioral problems associated with her dementia; continue supportive care and continue memantine. 4-Diabetes mellitus: continue SSI and lantus. A1C 6.3 5-HLd: continue zocor. 6-Chronic compensated diastolic CHF: EF 16% and findings suggesting grade 2 diastolic dysfunction. Continue current medication regimen and also low sodium diet. 7-HTN: continue lisinopril.; stable. 8-DVT: Lovenox.    LOS: 2 days   [name] 09/14/2011, 11:01 AM

## 2011-09-14 NOTE — Progress Notes (Signed)
INITIAL ADULT NUTRITION ASSESSMENT Date: 09/14/2011   Time: 11:41 AM  Reason for Assessment: Low Braden  ASSESSMENT:  Female, 77 years old  Dx: Hemiparesis, right  Hx:  Past Medical History  Diagnosis Date  . Hypertension   . Diabetes mellitus   . Thyroid disease   . DEMENTIA    Related Meds: calcium-vitamin D, insulin, levothyroxine, lisinopril, simvastatin, tramadol  Ht: 152.4 cm (estimated)   Wt: 194 lb 3.6 oz (88.1 kg)  Ideal Wt: 45.5kg % Ideal Wt: 194%  Usual Wt: unknown % Usual Wt: unknown  BMI: 37.9  Food/Nutrition Related Hx: Regular diet PTA, eating well PTA  Labs from 10/12/10: Sodium 139 mEq/L      Potassium 4.2 mEq/L      Chloride 98 mEq/L      CO2 30 mEq/L      Glucose, Bld 167 mg/dL H     BUN 16 mg/dL      Creatinine, Ser 1.61 mg/dL      Calcium 9.7 mg/dL      Total Protein 8.3 g/dL      Albumin 3.4 g/dL L  [id number] 4.0% CBG: 981 - 126 - 178  I/O last 3 completed shifts: In: 43.3 [I.V.:43.3] Out: -     Diet Order: Dysphagia 1 diet, thin liquids  Supplements/Tube Feeding: none  IVF: none  Estimated Nutritional Needs:   Kcal: 1600 - 1800 Protein: 65 - 85 g Fluid: 1.6 - 1.8 L/d  Patient with dementia, history obtained from daughter at bedside. Pt eating well PTA. Currently eating fair, doesn't like pureed consistency foods. Agreeable to supplements. Family denies wt change. SLP note on 11/12 reports pt unable to consume solids 2/2 mentation.  NUTRITION DIAGNOSIS: -Inadequate protein energy intake (NI-5.3).  Status: Ongoing  RELATED TO: poor PO intake 2/2 pureed consistency food preferences  AS EVIDENCE BY: family report.  MONITORING/EVALUATION(Goals): Goal: Pt to consume >75% of meals Monitor: PO adequacy, labs, weights  EDUCATION NEEDS: -No education needs identified at this time  INTERVENTION: 1. Glucerna Shakes PO daily 2. RD to follow nutrition care plan  Dietitian #:[id number]  DOCUMENTATION CODES Per approved criteria  -Obesity  Unspecified    Adair Laundry 09/14/2011, 11:41 AM

## 2011-09-15 LAB — CBC
Hemoglobin: 12.4 g/dL (ref 12.0–15.0)
MCH: 31.3 pg (ref 26.0–34.0)
Platelets: 195 10*3/uL (ref 150–400)
RBC: 3.96 MIL/uL (ref 3.87–5.11)
WBC: 6.8 10*3/uL (ref 4.0–10.5)

## 2011-09-15 LAB — GLUCOSE, CAPILLARY
Glucose-Capillary: 167 mg/dL — ABNORMAL HIGH (ref 70–99)
Glucose-Capillary: 211 mg/dL — ABNORMAL HIGH (ref 70–99)

## 2011-09-15 LAB — BASIC METABOLIC PANEL
BUN: 16 mg/dL (ref 6–23)
Chloride: 100 mEq/L (ref 96–112)
GFR calc Af Amer: 65 mL/min — ABNORMAL LOW (ref >90)
GFR calc non Af Amer: 56 mL/min — ABNORMAL LOW (ref >90)
Potassium: 4 mEq/L (ref 3.5–5.1)
Sodium: 137 mEq/L (ref 135–145)

## 2011-09-15 MED ORDER — INSULIN ASPART 100 UNIT/ML ~~LOC~~ SOLN: 0.0000 [IU] | Freq: Three times a day (TID) | SUBCUTANEOUS | Status: DC

## 2011-09-15 MED ORDER — CLOPIDOGREL BISULFATE 75 MG PO TABS: 75.0000 mg | ORAL_TABLET | Freq: Every day | ORAL | Status: DC

## 2011-09-15 MED ORDER — LEVOFLOXACIN 500 MG PO TABS: 500.0000 mg | ORAL_TABLET | Freq: Every day | ORAL | Status: AC

## 2011-09-15 NOTE — Progress Notes (Signed)
CSW faxed dc summary and medications to SNF and received permission for patient to return. CSW prepared dc packet. CSW informed patient, patient's dtr Lynden Ang) and RN of dc and all were agreeable. CSW coordinated transportation via [place]. CSW is signing off.

## 2011-09-15 NOTE — Discharge Summary (Signed)
DISCHARGE SUMMARY  [name]  MR#: [id number]  DOB:1928-12-12  Date of Admission: 09/12/2011 Date of Discharge: 09/15/2011  Attending Physician:Monesha Monreal  Patient's HYQ:MVHQI, Lenon Curt, MD, MD  Consults:  Discharge Diagnoses: .Hemiparesis, right .UTI (lower urinary tract infection) .Dementia .Diabetes mellitus .Hyperlipidemia .Hypertension congestive heart failure(Diastolic dysfunction) .Hypothyroidism .Deconditioning-For rehab .    Current Discharge Medication List    START taking these medications   Details  clopidogrel (PLAVIX) 75 MG tablet Take 1 tablet (75 mg total) by mouth daily with breakfast. Qty: 30 tablet, Refills: 1    insulin aspart (NOVOLOG) 100 UNIT/ML injection Inject 0-9 Units into the skin 3 (three) times daily with meals. Qty: 1 vial, Refills: 1    levofloxacin (LEVAQUIN) 500 MG tablet Take 1 tablet (500 mg total) by mouth daily. Qty: 7 tablet, Refills: 0      CONTINUE these medications which have NOT CHANGED   Details  acetaminophen (TYLENOL) 500 MG tablet Take 1,000 mg by mouth 2 (two) times daily.      aspirin 81 MG chewable tablet Chew 81 mg by mouth every morning.      calcium-vitamin D (OSCAL WITH D) 500-200 MG-UNIT per tablet Take 1 tablet by mouth 2 (two) times daily.      chlorhexidine (PERIDEX) 0.12 % solution Use as directed 15 mLs in the mouth or throat 2 (two) times daily. Brush on teeth and gums twice a day for gingivitis     docusate sodium (COLACE) 100 MG capsule Take 100 mg by mouth at bedtime.      donepezil (ARICEPT) 5 MG tablet Take 5 mg by mouth at bedtime.      furosemide (LASIX) 20 MG tablet Take 20 mg by mouth every morning.      hydrocortisone (PROCTOCARE-HC) 2.5 % rectal cream Place 1 application rectally as needed. Apply to anal area after bowel movement as needed for discomfort     insulin glargine (LANTUS) 100 UNIT/ML injection Inject 12 Units into the skin at bedtime.      levothyroxine  (SYNTHROID, LEVOTHROID) 100 MCG tablet Take 100 mcg by mouth daily.      lisinopril (PRINIVIL,ZESTRIL) 5 MG tablet Take 5 mg by mouth daily.      memantine (NAMENDA) 10 MG tablet Take 10 mg by mouth 2 (two) times daily.      metFORMIN (GLUCOPHAGE) 1000 MG tablet Take 1,000 mg by mouth 2 (two) times daily with a meal.      polyethylene glycol (MIRALAX / GLYCOLAX) packet Take 17 g by mouth daily.      simvastatin (ZOCOR) 20 MG tablet Take 20 mg by mouth at bedtime.        STOP taking these medications     traMADol (ULTRAM) 50 MG tablet      traMADol (ULTRAM) 50 MG tablet           Hospital Course: Patient is a 77 year old Caucasian female with history of dementia was admitted to the hospital with right-sided weakness and also right facial droop. No history of loss of consciousness. No history of trauma. she was also found to have UTI. she was admitted to be ruled out for CVA and she was also treated for a urinary tract infection with IV rocephin. Patient was however seen by me  for the first time in this admission today. She denied any chest pain, she denied any shortness of breath. She denied any systemic symptoms. Examination of patient showed right hemiparesis.    Day of Discharge BP 136/80  Pulse 72  Temp(Src) 98.4 F (36.9 C) (Oral)  Resp 17  Wt 88.1 kg (194 lb 3.6 oz)  SpO2 97%  Physical Exam:not in distress. HEENT-pallor NECK-no jvd CHEST-clear to auscultation.No adventitial sounds CVS-s1 and s2.no murmurs ABD-soft.no organs palpable.Bowel sounds are present. EXT-trace pedal edema NEUR0-AAO X3.RT hemiparesis SKIN-no ecchymosis  Results for orders placed during the hospital encounter of 09/12/11 (from the past 24 hour(s))  GLUCOSE, CAPILLARY     Status: Abnormal   Collection Time   09/14/11 12:02 PM      Component Value Range   Glucose-Capillary 146 (*) 70 - 99 (mg/dL)  GLUCOSE, CAPILLARY     Status: Abnormal   Collection Time   09/14/11  4:24 PM       Component Value Range   Glucose-Capillary 184 (*) 70 - 99 (mg/dL)   Comment 1 Notify RN     Comment 2 Documented in Chart    GLUCOSE, CAPILLARY     Status: Abnormal   Collection Time   09/14/11  9:11 PM      Component Value Range   Glucose-Capillary 167 (*) 70 - 99 (mg/dL)  GLUCOSE, CAPILLARY     Status: Abnormal   Collection Time   09/15/11  6:22 AM      Component Value Range   Glucose-Capillary 167 (*) 70 - 99 (mg/dL)  BASIC METABOLIC PANEL     Status: Abnormal   Collection Time   09/15/11  6:40 AM      Component Value Range   Sodium 137  135 - 145 (mEq/L)   Potassium 4.0  3.5 - 5.1 (mEq/L)   Chloride 100  96 - 112 (mEq/L)   CO2 26  19 - 32 (mEq/L)   Glucose, Bld 178 (*) 70 - 99 (mg/dL)   BUN 16  6 - 23 (mg/dL)   Creatinine, Ser 9.60  0.50 - 1.10 (mg/dL)   Calcium 45.4  8.4 - 10.5 (mg/dL)   GFR calc non Af Amer 56 (*) >90 (mL/min)   GFR calc Af Amer 65 (*) >90 (mL/min)  CBC     Status: Normal   Collection Time   09/15/11  6:40 AM      Component Value Range   WBC 6.8  4.0 - 10.5 (K/uL)   RBC 3.96  3.87 - 5.11 (MIL/uL)   Hemoglobin 12.4  12.0 - 15.0 (g/dL)   HCT 09.8  11.9 - 14.7 (%)   MCV 98.0  78.0 - 100.0 (fL)   MCH 31.3  26.0 - 34.0 (pg)   MCHC 32.0  30.0 - 36.0 (g/dL)   RDW 82.9  56.2 - 13.0 (%)   Platelets 195  150 - 400 (K/uL)    Disposition: Clinical stable   Follow-up Appts: Discharge Orders    Future Orders Please Complete By Expires   Diet - low sodium heart healthy      Increase activity slowly         Follow-up with physician at SNF  Signed: Jacki Couse 09/15/2011, 10:05 AM

## 2012-07-02 ENCOUNTER — Emergency Department (HOSPITAL_COMMUNITY)
Admission: EM | Admit: 2012-07-02 | Discharge: 2012-07-02 | Disposition: A | Discharge status: Skilled Nursing Facility | Payer: Medicare Other | Attending: Emergency Medicine | Admitting: Emergency Medicine

## 2012-07-02 ENCOUNTER — Encounter (HOSPITAL_COMMUNITY): Payer: Self-pay | Admitting: *Deleted

## 2012-07-02 ENCOUNTER — Emergency Department (HOSPITAL_COMMUNITY): Payer: Medicare Other

## 2012-07-02 DIAGNOSIS — S0003XA Contusion of scalp, initial encounter: Secondary | ICD-10-CM | POA: Insufficient documentation

## 2012-07-02 DIAGNOSIS — Z7901 Long term (current) use of anticoagulants: Secondary | ICD-10-CM | POA: Insufficient documentation

## 2012-07-02 DIAGNOSIS — S1093XA Contusion of unspecified part of neck, initial encounter: Secondary | ICD-10-CM | POA: Insufficient documentation

## 2012-07-02 DIAGNOSIS — F0390 Unspecified dementia without behavioral disturbance: Secondary | ICD-10-CM | POA: Insufficient documentation

## 2012-07-02 DIAGNOSIS — E119 Type 2 diabetes mellitus without complications: Secondary | ICD-10-CM | POA: Insufficient documentation

## 2012-07-02 DIAGNOSIS — T1490XA Injury, unspecified, initial encounter: Secondary | ICD-10-CM | POA: Insufficient documentation

## 2012-07-02 DIAGNOSIS — W19XXXA Unspecified fall, initial encounter: Secondary | ICD-10-CM

## 2012-07-02 DIAGNOSIS — M25559 Pain in unspecified hip: Secondary | ICD-10-CM | POA: Insufficient documentation

## 2012-07-02 DIAGNOSIS — N390 Urinary tract infection, site not specified: Secondary | ICD-10-CM | POA: Insufficient documentation

## 2012-07-02 DIAGNOSIS — I10 Essential (primary) hypertension: Secondary | ICD-10-CM | POA: Insufficient documentation

## 2012-07-02 DIAGNOSIS — Z794 Long term (current) use of insulin: Secondary | ICD-10-CM | POA: Insufficient documentation

## 2012-07-02 DIAGNOSIS — W050XXA Fall from non-moving wheelchair, initial encounter: Secondary | ICD-10-CM | POA: Insufficient documentation

## 2012-07-02 LAB — URINALYSIS, ROUTINE W REFLEX MICROSCOPIC
Bilirubin Urine: NEGATIVE
Ketones, ur: NEGATIVE mg/dL
Nitrite: POSITIVE — AB
Urobilinogen, UA: 0.2 mg/dL (ref 0.0–1.0)

## 2012-07-02 MED ORDER — FENTANYL CITRATE 0.05 MG/ML IJ SOLN
25.0000 ug | Freq: Once | INTRAMUSCULAR | Status: AC
  Administered 2012-07-02: 25 ug via INTRAVENOUS
  Filled 2012-07-02: qty 2

## 2012-07-02 MED ORDER — SULFAMETHOXAZOLE-TRIMETHOPRIM 800-160 MG PO TABS: 1.0000 | ORAL_TABLET | Freq: Two times a day (BID) | ORAL | Status: AC

## 2012-07-02 NOTE — ED Notes (Signed)
Patient resides at [facility].  Patient reported to have a fall, unwitnessed,  Found on floor in fetal position.  Patient with noted hematoma to the right side of her face and she is complaining of pain in her hip,  Left hip, some outward rotation noted.

## 2012-07-02 NOTE — ED Notes (Signed)
Daughter Noralee Chars [phone]

## 2012-07-02 NOTE — ED Notes (Signed)
Pt unable to get up to ambulate

## 2012-07-02 NOTE — ED Provider Notes (Signed)
5:30 PM Assumed care from [facility].  Plan for follow up of pending urine to ensure patient does not have a UTI.  Will follow up accordingly.  Patient currently HDS and without new complaints.     Results for orders placed during the hospital encounter of 07/02/12  URINALYSIS, ROUTINE W REFLEX MICROSCOPIC      Component Value Range   Color, Urine YELLOW  YELLOW   APPearance CLOUDY (*) CLEAR   Specific Gravity, Urine 1.019  1.005 - 1.030   pH 6.5  5.0 - 8.0   Glucose, UA NEGATIVE  NEGATIVE mg/dL   Hgb urine dipstick NEGATIVE  NEGATIVE   Bilirubin Urine NEGATIVE  NEGATIVE   Ketones, ur NEGATIVE  NEGATIVE mg/dL   Protein, ur NEGATIVE  NEGATIVE mg/dL   Urobilinogen, UA 0.2  0.0 - 1.0 mg/dL   Nitrite POSITIVE (*) NEGATIVE   Leukocytes, UA LARGE (*) NEGATIVE  URINE MICROSCOPIC-ADD ON      Component Value Range   Squamous Epithelial / LPF FEW (*) RARE   WBC, UA 21-50  <3 WBC/hpf   Bacteria, UA MANY (*) RARE     7:13 PM Review of urine studies shows evidence of UTI.  Thus will treat with bactrim.  Patient remains HDS, and pain controlled well controlled.  Will discharge back to facility with strict return precautions.    Johnney Ou, MD 07/03/12 [phone]

## 2012-07-02 NOTE — ED Provider Notes (Signed)
History     CSN: [id number]  Arrival date & time 07/02/12  1310   First MD Initiated Contact with Patient 07/02/12 1342      Chief Complaint  Patient presents with  . Fall    (Consider location/radiation/quality/duration/timing/severity/associated sxs/prior treatment) HPI [name] is a 78 y.o. female with advanced dementia who presents after an unwitnessed fall. She apparently fell out of her wheelchair onto her right side. She has a hematoma to the right side of her face. She is anticoagulated on Plavix. She is also complaining of pain to her left hip as well. Level V caveat due to dementia; hx from EMS and nursing home documentation.  Per documentation, patient is not ambulatory at baseline and is in a wheelchair.  Past Medical History  Diagnosis Date  . Hypertension   . Diabetes mellitus   . Thyroid disease   . DEMENTIA     Past Surgical History  Procedure Date  . Abdominal hysterectomy   . Thyroidectomy     Family History  Problem Relation Age of Onset  . Diabetes type II Other     History  Substance Use Topics  . Smoking status: Unknown If Ever Smoked  . Smokeless tobacco: Not on file  . Alcohol Use: No    OB History    Grav Para Term Preterm Abortions TAB SAB Ect Mult Living                  Review of Systems  Unable to perform ROS: Dementia    Allergies  Roxicet  Home Medications   Current Outpatient Rx  Name Route Sig Dispense Refill  . ACETAMINOPHEN 500 MG PO TABS Oral Take 1,000 mg by mouth 2 (two) times daily.      . ASPIRIN 81 MG PO CHEW Oral Chew 81 mg by mouth every morning.      . ATORVASTATIN CALCIUM 10 MG PO TABS Oral Take 10 mg by mouth every evening.    Marland Kitchen CALCIUM CARBONATE-VITAMIN D 500-200 MG-UNIT PO TABS Oral Take 1 tablet by mouth 2 (two) times daily.      . CHLORHEXIDINE GLUCONATE 0.12 % MT SOLN Mouth/Throat Use as directed 15 mLs in the mouth or throat 2 (two) times daily. Brush on teeth and gums twice a day for gingivitis      . CLOPIDOGREL BISULFATE 75 MG PO TABS Oral Take 75 mg by mouth daily with breakfast.    . DOCUSATE SODIUM 100 MG PO CAPS Oral Take 100 mg by mouth at bedtime.      . DONEPEZIL HCL 5 MG PO TABS Oral Take 5 mg by mouth at bedtime.      . FUROSEMIDE 20 MG PO TABS Oral Take 20 mg by mouth every morning.      Marland Kitchen HYDROCORTISONE 2.5 % RE CREA Rectal Place 1 application rectally as needed. Apply to anal area after bowel movement as needed for discomfort     . INSULIN GLARGINE 100 UNIT/ML [place] SOLN Subcutaneous Inject 20 Units into the skin at bedtime.     Marland Kitchen LEVOTHYROXINE SODIUM 112 MCG PO TABS Oral Take 112 mcg by mouth daily.    Marland Kitchen LISINOPRIL 5 MG PO TABS Oral Take 5 mg by mouth daily.      Marland Kitchen MEMANTINE HCL 10 MG PO TABS Oral Take 10 mg by mouth 2 (two) times daily.      Marland Kitchen METFORMIN HCL 1000 MG PO TABS Oral Take 1,000 mg by mouth 2 (two) times  daily with a meal.      . POLYETHYLENE GLYCOL 3350 PO PACK Oral Take 17 g by mouth daily.        BP 111/75  Pulse 85  Temp 98.5 F (36.9 C) (Oral)  Resp 18  SpO2 96%  Physical Exam  Nursing note and vitals reviewed. Constitutional: She appears well-developed and well-nourished. No distress.  HENT:  Head: Normocephalic.  Mouth/Throat: Oropharynx is clear and moist. No oropharyngeal exudate.       Abrasion noted to the right temple. Nontender to palpation to the same. No active bleeding.  Eyes: EOM are normal. Pupils are equal, round, and reactive to light.  Neck: Normal range of motion.  Cardiovascular: Normal rate, regular rhythm and normal heart sounds.   Pulmonary/Chest: Effort normal and breath sounds normal. She exhibits no tenderness.  Abdominal: Soft. Bowel sounds are normal. There is no tenderness. There is no rebound and no guarding.  Musculoskeletal: Normal range of motion.       Left hip: Slight tenderness to palpation on exam. No palpable crepitus or deformity. No obvious shortening or rotation. Neurovascularly intact distally with sensory  intact to light touch. DP/PT pulses 2+.  Neurological: She is alert. No cranial nerve deficit. She exhibits normal muscle tone. Coordination normal.       Patient essentially nonverbal secondary to dementia; able to answer yes and no to some questions. Cooperative with exam. No gross neurologic deficits.  Skin: Skin is warm and dry. She is not diaphoretic.  Psychiatric: She has a normal mood and affect.    ED Course  Procedures (including critical care time)   Labs Reviewed  URINALYSIS, ROUTINE W REFLEX MICROSCOPIC   Dg Hip Complete Left  07/02/2012  *RADIOLOGY REPORT*  Clinical Data: Left hip pain following a fall.  LEFT HIP - COMPLETE 2+ VIEW  Comparison: Left femur dated 05/05/2008.  Findings: Normal appearing left hip and pelvic bones.  No fracture or dislocation seen.  Lower lumbar spine bone and hardware fusion.  IMPRESSION: No fracture or dislocation.   Original Report Authenticated By: Darrol Angel, M.D.    Ct Head Wo Contrast  07/02/2012  *RADIOLOGY REPORT*  Clinical Data: Forehead hematoma following a fall.  On Plavix.  CT HEAD WITHOUT CONTRAST  Technique:  Contiguous axial images were obtained from the base of the skull through the vertex without contrast.  Comparison: Head CT dated 09/12/2011 and brain MR dated 09/14/2011.  Findings: Stable enlarged ventricles and subarachnoid spaces. Stable mild patchy white matter low density in both cerebral hemispheres.  No skull fracture, intracranial hemorrhage or paranasal sinus air-fluid levels.  Again demonstrated is opacification of the included portion of the left maxillary sinus as well as subtotal opacification of the left ethmoid and sphenoid sinuses.  The left frontal sinus remains totally opacified.  On 09/12/2011, the left ethmoid and sphenoid sinuses were completely opacified.  Decreased mucosal thickening in the sphenoid sinus on the right.  IMPRESSION:  1.  Stable atrophy with enlargement of the ventricles out of proportion to the  sulci, again raising the possibility of communicating hydrocephalus. 2.  Stable mild chronic small vessel white matter ischemic changes in both cerebral hemispheres. 3.  Extensive chronic sinusitis with some improvement.   Original Report Authenticated By: Darrol Angel, M.D.      1. Fall       MDM  Pt with advanced dementia presents after unwitnessed fall from wheelchair. Was c/o pain to the L hip; no visible deformity to  the area. Xrays reviewed by me; negative for fx. As pt did have abrasions to temple area and is on Plavix, we did obtain CT head which is negative. Pt does have hx of UTIs but is afebrile and nontoxic appearing. She is at baseline per facility documentation. Plan to check urine before discharging to facility. Pt signed out at shift change to Drs. Powers and Ronald Lobo, pending this.        Grant Fontana, PA-C 07/02/12 1732

## 2012-07-03 NOTE — ED Provider Notes (Signed)
Medical screening examination/treatment/procedure(s) were performed by non-physician practitioner and as supervising physician I was immediately available for consultation/collaboration.  Shakevia Sarris R. Emeree Mahler, MD 07/03/12 1559 

## 2012-07-03 NOTE — ED Provider Notes (Signed)
I saw and evaluated the patient, reviewed the resident's note and I agree with the findings and plan.  Tobin Chad, MD 07/03/12 [phone]

## 2012-07-09 LAB — CULTURE, BLOOD (ROUTINE X 2): Culture: NO GROWTH

## 2012-12-14 IMAGING — CT CT HEAD W/O CM
1 of 2 series · 15 of 30 positions shown, 19 images · non-contrast
Comparison: Head CT dated 09/12/2011 and brain MR dated 09/14/2011.

CLINICAL DATA: Forehead hematoma following a fall.  On Plavix.

CT HEAD WITHOUT CONTRAST
TECHNIQUE: Contiguous axial images were obtained from the base of
the skull through the vertex without contrast.

[Series 3: recon 2: brain · axial · 0.49mm/px · z∈[+118,+259]mm · 15 of 64 slices shown, 19 images]
[im 4/64  brain]
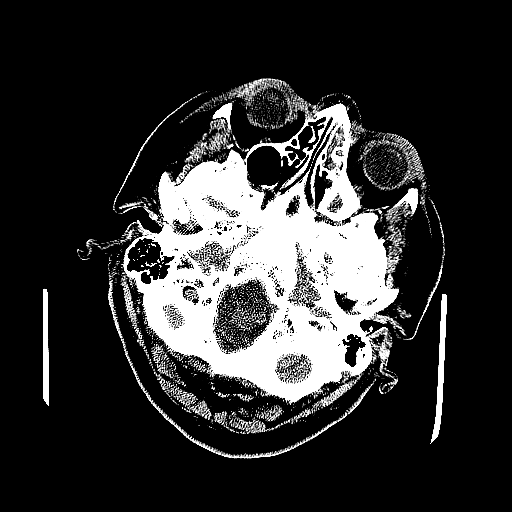
[im 4/64  bone]
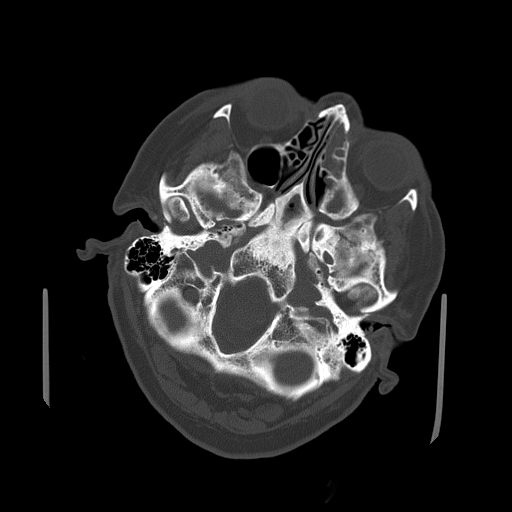
[im 7/64  brain]
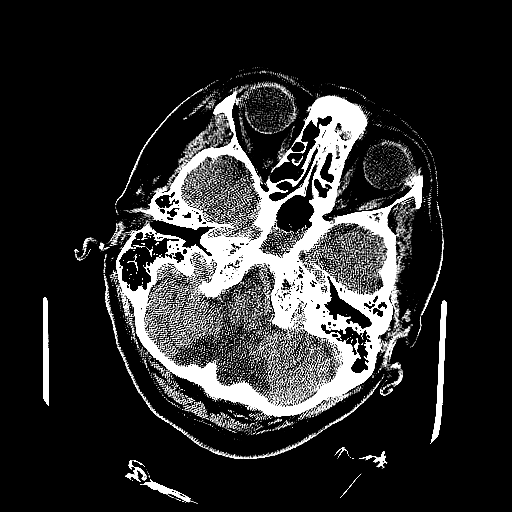
[im 14/64  brain]
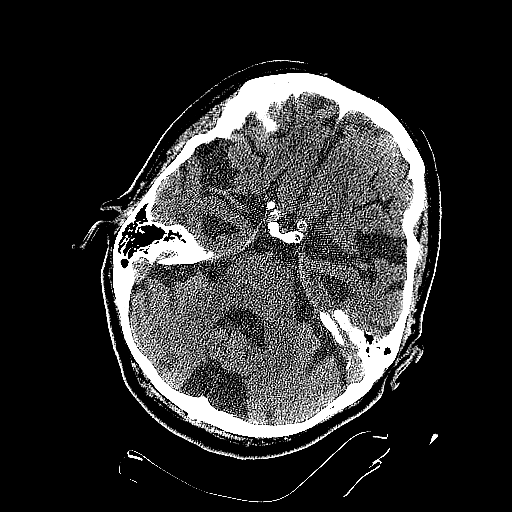
[im 17/64  brain]
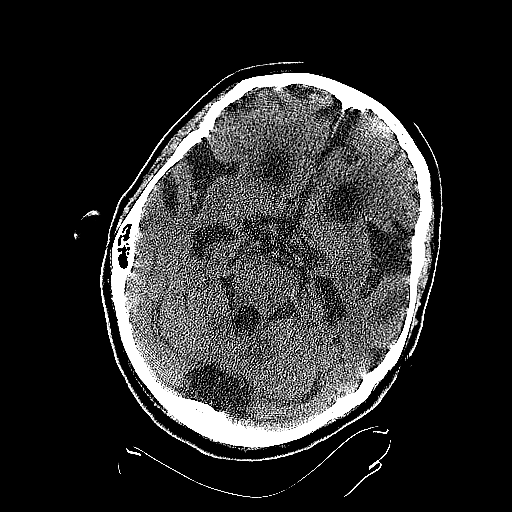
[im 20/64  brain]
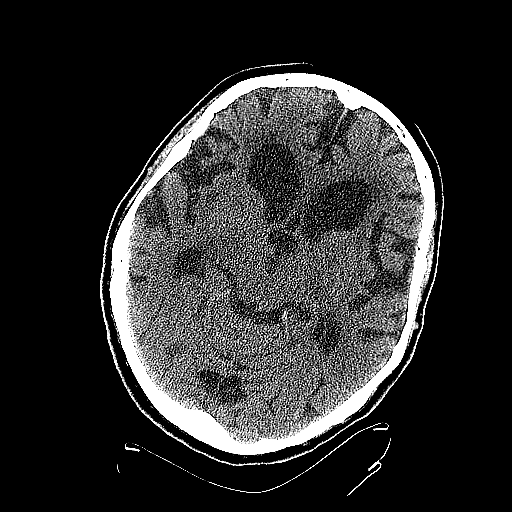
[im 20/64  bone]
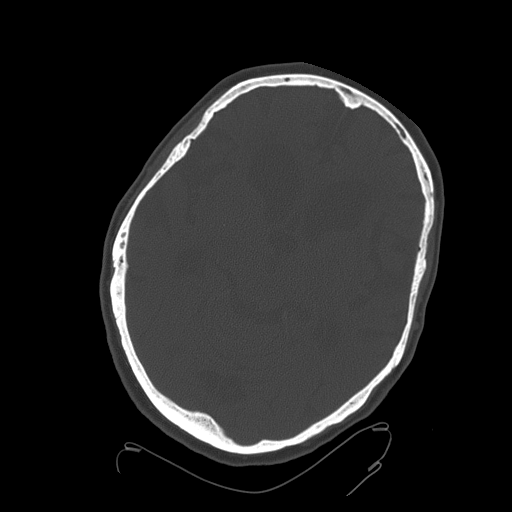
[im 24/64  brain]
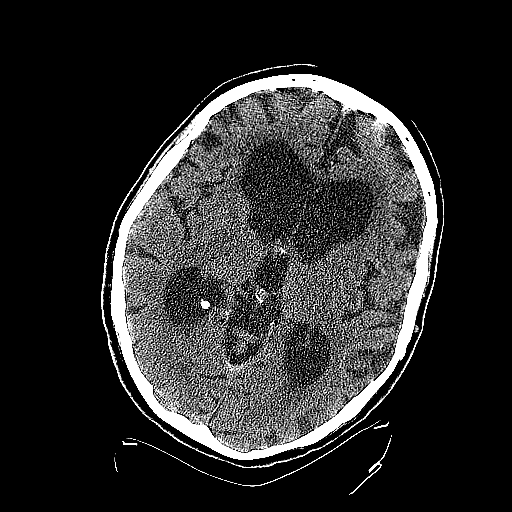
[im 27/64  brain]
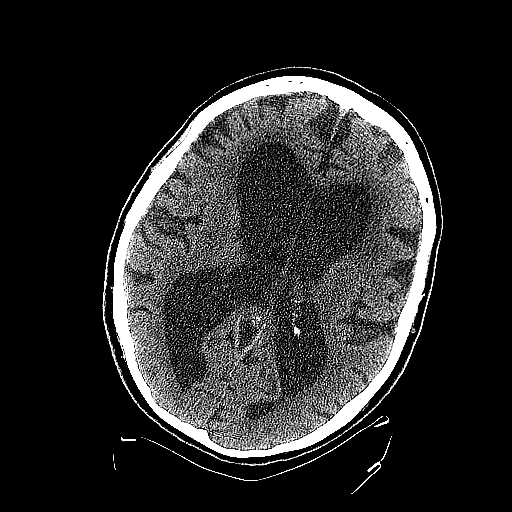
[im 34/64  brain]
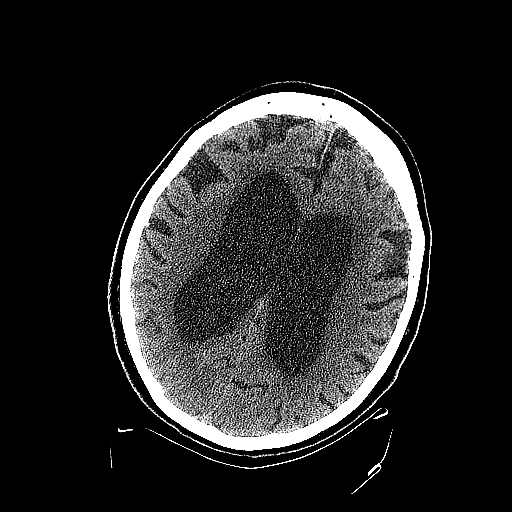
[im 37/64  brain]
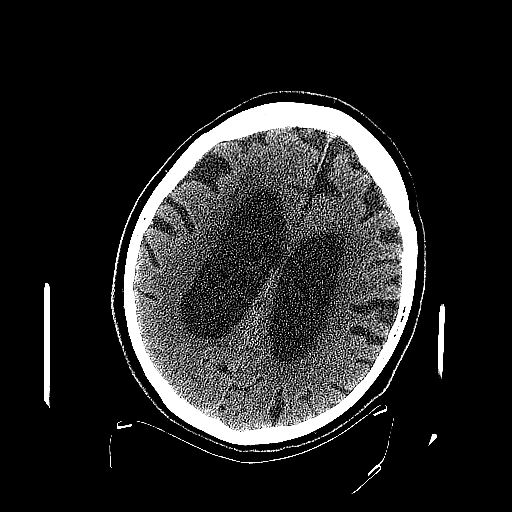
[im 37/64  bone]
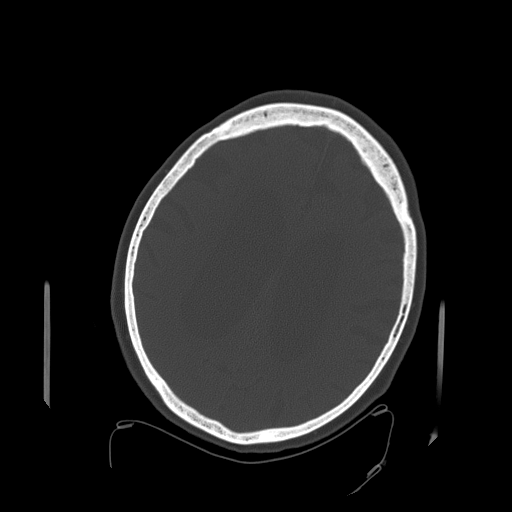
[im 40/64  brain]
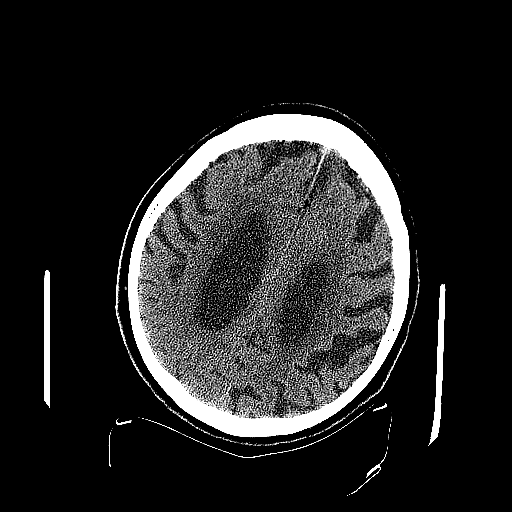
[im 44/64  brain]
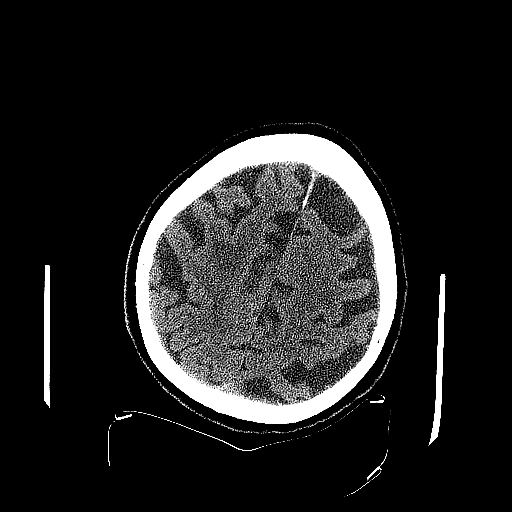
[im 47/64  brain]
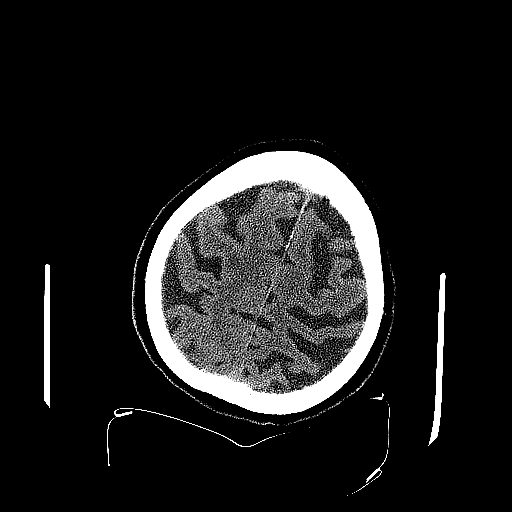
[im 54/64  brain]
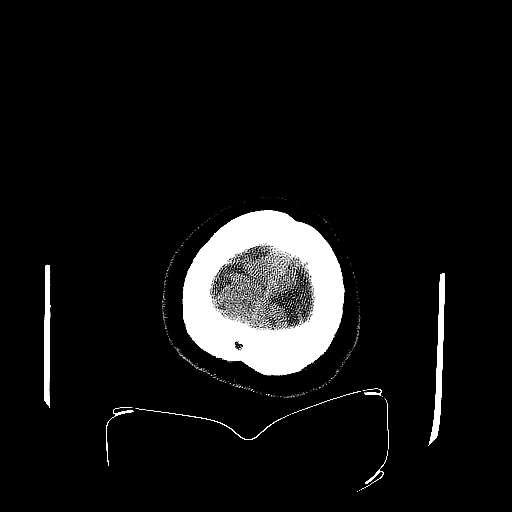
[im 54/64  bone]
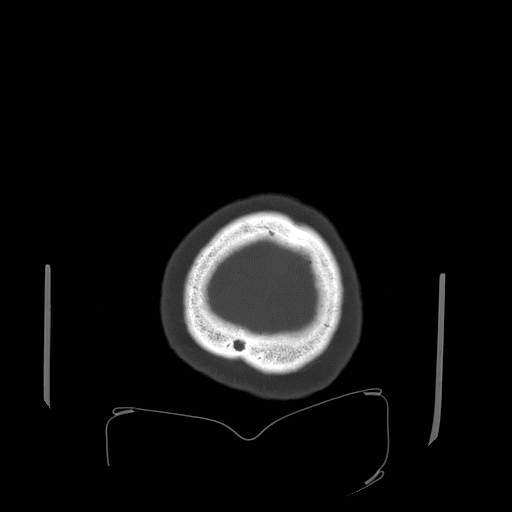
[im 57/64  brain]
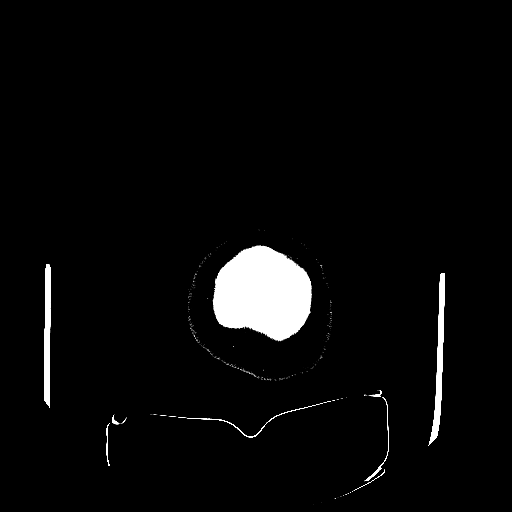
[im 60/64  brain]
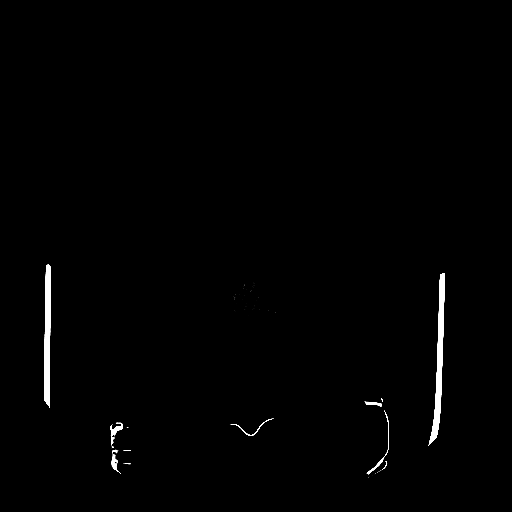

[15 of 30 positions shown; findings below may reference images not displayed]

FINDINGS: Stable enlarged ventricles and subarachnoid spaces.
Stable mild patchy white matter low density in both cerebral
hemispheres.  No skull fracture, intracranial hemorrhage or
paranasal sinus air-fluid levels.  Again demonstrated is
opacification of the included portion of the left maxillary sinus
as well as subtotal opacification of the left ethmoid and sphenoid
sinuses.  The left frontal sinus remains totally opacified.  On
09/12/2011, the left ethmoid and sphenoid sinuses were completely
opacified.  Decreased mucosal thickening in the sphenoid sinus on
the right.
IMPRESSION: 1.  Stable atrophy with enlargement of the ventricles out of
proportion to the sulci, again raising the possibility of
communicating hydrocephalus.
2.  Stable mild chronic small vessel white matter ischemic changes
in both cerebral hemispheres.
3.  Extensive chronic sinusitis with some improvement.

## 2013-03-22 ENCOUNTER — Non-Acute Institutional Stay (SKILLED_NURSING_FACILITY): Payer: Medicare Other | Admitting: Geriatric Medicine

## 2013-03-22 DIAGNOSIS — N189 Chronic kidney disease, unspecified: Secondary | ICD-10-CM

## 2013-03-22 DIAGNOSIS — G309 Alzheimer's disease, unspecified: Secondary | ICD-10-CM

## 2013-03-22 DIAGNOSIS — E1129 Type 2 diabetes mellitus with other diabetic kidney complication: Secondary | ICD-10-CM

## 2013-03-22 DIAGNOSIS — R569 Unspecified convulsions: Secondary | ICD-10-CM

## 2013-03-22 DIAGNOSIS — F0280 Dementia in other diseases classified elsewhere without behavioral disturbance: Secondary | ICD-10-CM

## 2013-03-22 DIAGNOSIS — E1122 Type 2 diabetes mellitus with diabetic chronic kidney disease: Secondary | ICD-10-CM

## 2013-03-22 MED ORDER — LEVETIRACETAM 250 MG PO TABS: 250.0000 mg | ORAL_TABLET | Freq: Two times a day (BID) | ORAL | Status: AC

## 2013-03-22 NOTE — Progress Notes (Signed)
Patient ID: [name], female   DOB: [birth year], 79 y.o.   MRN: [id number] [name] [phone])  Chief Complaint  Patient presents with  . Seizures    HPI: This is a 79 y.o. female resident of [facility].  Evaluation is requested today due to seizure activity.  Nurse reports that was witnessed seizure this a.m., patient had total body jerking and posturing. O2 sats were decreased, supplemental oxygen was added. No recurrent seizure activity thus far  Allergies  Allergies  Allergen Reactions  . Roxicet (Oxycodone-Acetaminophen) Other (See Comments)    Per medical record- unknown reaction   Medications Reviewed  Data Reviewed       JWJ:[id number], external 07/13/12 TSH 7.32 08/24/12 TSH 2.19, free T4 1.5, free T3 2.6  11/01/12: WBC 7.3, hgb 12.2, Hct 36.1, PLt 211 Glu 114, BUN 32, Cr. 1.22, Na 143, K+ 4.2. Protein/ LFTs WNL GFR 42 TC 142, TG216, HDL 28, LDL 71 TSH 2.19 A1C 5.8  POCT CBG:  Fasting last 6 days 157/126/143/146/148/157     Review of Systems  DATA OBTAINED: from patient, nurse, medical record,  GENERAL: Feels well   No fevers, change in appetite or weight.  Very limited activity SKIN: No itch, rash or open wounds NOSE: No congestion, drainage or bleeding MOUTH/THROAT: No apparent mouth or tooth pain  No sore throat No difficulty chewing or swallowing  RESPIRATORY: No cough, wheezing, SOB CARDIAC: No chest pain,  No edema. GI: No abdominal pain  No N/V/D or constipation  Incontinent GU: No dysuria, frequency or urgency  No change in urine volume or character   Incontinent MUSCULOSKELETAL: No joint pain, swelling. Joint stiffness present  Nonambulatory, requires Hoyer lift for transfers NEUROLOGIC: No dizziness, fainting, headache   No change in mental status (severe dementia)  PSYCHIATRIC: Sleeps well.  No behavior issue.   Physical Exam Filed Vitals:   03/22/13 1135  BP: 140/75  Pulse: 80   Resp: 18  Weight: 177 lb 12.8 oz (80.65 kg)  SpO2: 97%   GENERAL APPEARANCE: No acute distress, appropriately groomed, obese body habitus. Alert, cooperative, nonverbal HEAD: Normocephalic, atraumatic EYES: Conjunctiva/lids clear. Pupils round, reactive.  EARS: External exam WNL. Hearing grossly normal. NOSE: No deformity or discharge. MOUTH/THROAT: Lips w/o lesions. Oral mucosa, tongue moist, w/o lesion. Oropharynx w/o redness or lesions.  NECK: Supple, full ROM. No thyroid tenderness, enlargement or nodule LYMPHATICS: No head, neck or supraclavicular adenopathy RESPIRATORY: Breathing is even, unlabored. Lung sounds are clear and full.  CARDIOVASCULAR: Heart RRR. No murmur or extra heart sounds  EDEMA: No peripheral  edema.  GASTROINTESTINAL: Abdomen is soft, non-tender, not distended w/ normal bowel sounds.  MUSCULOSKELETAL: No spontaneous movement of extremities. Passive range of motion with stiff movements of all joints  NEUROLOGIC: Unable to assess orientation, nonverbal, severe dementia. PSYCHIATRIC: Mood and affect appropriate to situation  ASSESSMENT/PLAN  Alzheimer's disease No further weight loss over the last few months however patient is severely demented with diminished interactions. P.o. intake has actually increased lately likely because she is being fed.  Have discussed general goals of care with patient's daughters. They desire comfort measures, no hospitalization, limited interventions.  Diabetes mellitus with chronic kidney disease Recent fasting blood sugars consistently elevated. Patient is being fed and is eating well. Increase Lantus dose to 25 units at bedtime  Seizures New-onset seizure activity today. Likely related to Alzheimer's dementia. Seizure activity was pretty significant today, likely will recur recur. Recommend initiating medication  to prevent seizures, discussed with daughter [name] and she agrees. Will start low-dose Keppra.  25 minutes with  patient, family and nurse.  Follow up: Routine or as needed  [name] Maria Kwan, NP-C 03/22/2013

## 2013-04-06 ENCOUNTER — Encounter: Payer: Self-pay | Admitting: Geriatric Medicine

## 2013-04-06 DIAGNOSIS — R569 Unspecified convulsions: Secondary | ICD-10-CM

## 2013-04-06 DIAGNOSIS — F0280 Dementia in other diseases classified elsewhere without behavioral disturbance: Secondary | ICD-10-CM | POA: Insufficient documentation

## 2013-04-06 HISTORY — DX: Unspecified convulsions: R56.9

## 2013-04-06 NOTE — Assessment & Plan Note (Signed)
New-onset seizure activity today. Likely related to Alzheimer's dementia. Seizure activity was pretty significant today, likely will recur recur. Recommend initiating medication to prevent seizures, discussed with daughter Amy and she agrees. Will start low-dose Keppra.

## 2013-04-06 NOTE — Assessment & Plan Note (Addendum)
No further weight loss over the last few months however patient is severely demented with diminished interactions. P.o. intake has actually increased lately likely because she is being fed.  Have discussed general goals of care with patient's daughters. They desire comfort measures, no hospitalization, limited interventions.

## 2013-04-06 NOTE — Assessment & Plan Note (Signed)
Recent fasting blood sugars consistently elevated. Patient is being fed and is eating well. Increase Lantus dose to 25 units at bedtime

## 2013-06-19 LAB — LIPID PANEL
CHOLESTEROL: 234 mg/dL — AB (ref 0–200)
HDL: 30 mg/dL — AB (ref 35–70)
LDL CALC: 144 mg/dL
Triglycerides: 300 mg/dL — AB (ref 40–160)

## 2013-06-19 LAB — TSH: TSH: 1.73 u[IU]/mL (ref 0.41–5.90)

## 2013-07-10 ENCOUNTER — Encounter: Payer: Self-pay | Admitting: Internal Medicine

## 2013-08-23 ENCOUNTER — Encounter: Payer: Self-pay | Admitting: Geriatric Medicine

## 2013-08-23 ENCOUNTER — Non-Acute Institutional Stay (SKILLED_NURSING_FACILITY): Payer: Medicare Other | Admitting: Geriatric Medicine

## 2013-08-23 DIAGNOSIS — Z8673 Personal history of transient ischemic attack (TIA), and cerebral infarction without residual deficits: Secondary | ICD-10-CM

## 2013-08-23 DIAGNOSIS — E079 Disorder of thyroid, unspecified: Secondary | ICD-10-CM

## 2013-08-23 DIAGNOSIS — E1129 Type 2 diabetes mellitus with other diabetic kidney complication: Secondary | ICD-10-CM

## 2013-08-23 DIAGNOSIS — N189 Chronic kidney disease, unspecified: Secondary | ICD-10-CM

## 2013-08-23 DIAGNOSIS — E1122 Type 2 diabetes mellitus with diabetic chronic kidney disease: Secondary | ICD-10-CM

## 2013-08-23 DIAGNOSIS — F0280 Dementia in other diseases classified elsewhere without behavioral disturbance: Secondary | ICD-10-CM

## 2013-08-23 DIAGNOSIS — I10 Essential (primary) hypertension: Secondary | ICD-10-CM

## 2013-08-23 DIAGNOSIS — R569 Unspecified convulsions: Secondary | ICD-10-CM

## 2013-08-23 DIAGNOSIS — Z66 Do not resuscitate: Secondary | ICD-10-CM

## 2013-08-23 NOTE — Assessment & Plan Note (Signed)
Recent fasting blood sugars remain elevated, greater than 170. Patient continues to be fed, diet order is regular diet consistency, no concentrated sweets sugar-free desserts. Increase Lantus insulin to 27 units daily.

## 2013-08-23 NOTE — Progress Notes (Signed)
Patient ID: [name], female   DOB: [birth year], 79 y.o.   MRN: [id number] [name] (31)  Code Status: DNR. MOST form  Contact Information   Name Relation Home Work [place] Daughter [phone]   Parks,Maria Daughter [phone]       Chief Complaint  Patient presents with  . Medical Managment of Chronic Issues    Annual Update    HPI: This is a 79 y.o. female resident of [facility]. This patient has not had a hospitalization, serious illness or injury in the last year. Patient has demonstrated further decline due to Alzheimer disease.   Last visit:  Alzheimer's disease  No further weight loss over the last few months however patient is severely demented with diminished interactions. P.o. intake has actually increased lately likely because she is being fed.  Have discussed general goals of care with patient's daughters. They desire comfort measures, no hospitalization, limited interventions.  Diabetes mellitus with chronic kidney disease  Recent fasting blood sugars consistently elevated. Patient is being fed and is eating well. Increase Lantus dose to 25 units at bedtime  Seizures  New-onset seizure activity today. Likely related to Alzheimer's dementia. Seizure activity was pretty significant today, likely will recur recur. Recommend initiating medication to prevent seizures, discussed with daughter [name] and she agrees. Will start low-dose Keppra.  Over the last year verbal and non verbal interactions have declined considerably; patient is nonverbal at this time, does respond to her name. She is completely dependent with all activities including feeding.   Patient experienced seizure activity June 2014. She's been treated with Keppra since that time, no further seizure activity. Reviewed MOST form and goals of care with the patient's  daughter after this episode. Overt shingles care remains comfort , avoid life-prolonging measures.  Diabetes medications continue; patient continues to keep area well. Review of records shows recent fasting CBGs remained elevated, greater than 170 frequently   Functional Status Bathing: Dependent Bed Mobility: Dependent Bladder Management: Diapers / Pads, Bowel Management: Diapers / Pads Diet / Swallowing: PO Diet: Regular, Feeding: Dependent  Hygiene and Grooming: Dependent, Toileting / Clothing: Dependent Transfers: Dependent Secondary school teacher)    Allergies  Allergen Reactions  . Percocet [Oxycodone-Acetaminophen]   . Roxicet [Oxycodone-Acetaminophen] Other (See Comments)    Per medical record- unknown reaction   Medications    Medication List       This list is accurate as of: 08/23/13 11:59 PM.  Always use your most recent med list.               acetaminophen 500 MG tablet  Commonly known as:  TYLENOL  Take 500 mg by mouth 2 (two) times daily.     aspirin 81 MG chewable tablet  Chew 81 mg by mouth every morning.     docusate sodium 100 MG capsule  Commonly known as:  COLACE  Take 100 mg by mouth at bedtime.     insulin glargine 100 UNIT/ML injection  Commonly known as:  LANTUS  Inject 25 Units into the skin at bedtime.     levETIRAcetam 250 MG tablet  Commonly known as:  KEPPRA  Take 1 tablet (250 mg total) by mouth every 12 (twelve) hours.     levothyroxine 125 MCG tablet  Commonly known as:  SYNTHROID, LEVOTHROID  Take 125 mcg by mouth daily before breakfast.     lisinopril 5 MG tablet  Commonly known as:  PRINIVIL,ZESTRIL  Take 5 mg by mouth daily.     polyethylene glycol packet  Commonly known as:  MIRALAX / GLYCOLAX  Take 17 g by mouth daily.        DATA REVIEWED  Radiologic Exams  [facility] 2006 DEXA scan: Osteoporosis  7 2007 lumbar pelvic x-rays: Disc space narrowing, no pelvic fracture, degenerative changes both hips 8 2007 epidural  injection for pain management  02/18/2009 mammogram: Left breast nodule 02/26/2009 Leftbreast ultrasound: 1.1 mm nodule, likely fibroadenoma or lymph node.   10/12/2010 chest x-ray: Due to positive PPD test no radiographically evidence of tuberculosis. No cardiomegaly, congestion, effusion. Minimal bibasilar atelectasis  11/11/ 2012 CT head: Ventriculomegaly without acute hydrocephalus, possibly NPH. No acute change  MRI head: Extensive volume loss and atrophy. Chronic ischemic changes, no acute infarct, no cerebral aneurysm 09/13/2011 carotid Doppler: No significant ICA stenosis bilaterally. Vertebral flow is antegrade    Cardiovascular Exams 9 2007 venous Doppler study  09/13/2011: 2D echocardiogram: EF 55%. In the with mild regurgitation, MA mild dilation. Grade 2 diastolic dysfunction  Laboratory Studies  [facility] lab 07/13/12 TSH 7.32 08/24/12 TSH 2.19, free T4 1.5, free T3 2.6  11/01/12: WBC 7.3, hgb 12.2, Hct 36.1, PLt 211 Glu 114, BUN 32, Cr. 1.22, Na 143, K+ 4.2. Protein/ LFTs WNL GFR 42 TC 142, TG216, HDL 28, LDL 71 TSH 2.19 A1C 5.8  06/19/2013 glucose 123, BUN 32, creatinine 1.13, sodium 143, potassium 4.4. Protein/LFTs WNL.  Total cholesterol 234, triglyceride 300, HDL 30, LDL 144  TSH 1.73  A1c 6.0  POCT ([facility])  CBG:  Fasting last 6 days 187/202/198/199/178/177        Review of Systems  DATA OBTAINED: from nurse, medical record GENERAL:  No fevers, fatigue, change in appetite or weight SKIN: No itch, rash or open wounds EYES: No eye pain or itching   EARS: No earache, change in hearing NOSE: No congestion, drainage or bleeding MOUTH/THROAT: No mouth or tooth pain          No difficulty chewing or swallowing RESPIRATORY: No cough, wheezing, SOB CARDIAC: No chest pain   No edema. CHEST/BREASTS: No discharge or lumps in breasts GI: No abdominal pain  No N/V/D or constipation   Incontinent GU: No dysuria   No change in urine volume or character    Incontinent   MUSCULOSKELETAL: No joint pain, swelling or stiffness  No back pain   NEUROLOGIC: No fainting, headache  No change in mental status (severe dementia).  PSYCHIATRIC: No signs of anxiety, depression Sleeps well.  No behavior issue.    Physical Exam Filed Vitals:   08/23/13 1523  BP: 138/75  Pulse: 85  Temp: 97.3 F (36.3 C)  Resp: 16  Height: 5\' 7"  (1.702 m)  Weight: 185 lb 14.4 oz (84.324 kg)   Body mass index is 29.11 kg/(m^2).  GENERAL APPEARANCE: No acute distress, appropriately groomed, Overweight body habitus.  Awake, nonverbal, opens eyes in response to her name. Cannot follow directions SKIN: No diaphoresis, rash, unusual lesions, wounds HEAD: Normocephalic, atraumatic EYES: Conjunctiva/lids clear. Pupils round, reactive. Marland Kitchen  EARS: External exam WNL Hearing grossly normal. NOSE: No deformity or discharge. MOUTH/THROAT: Lips w/o lesions. Will not open mouth NECK:  No thyroid tenderness, enlargement or nodule LYMPHATICS: No head, neck or supraclavicular adenopathy RESPIRATORY: Breathing is even, unlabored. Lung sounds are clear, shallow   CARDIOVASCULAR: Heart RRR. No murmur or extra heart sounds  ARTERIAL: No carotid bruit.   VENOUS: No varicosities. No venous stasis  skin changes  EDEMA: No peripheral or periorbital edema.  GASTROINTESTINAL: Abdomen is obese, soft, non-tender, not distended w/ decreased bowel sounds. No hepatic or splenic enlargement. No mass, ventral or inguinal hernia. GENITOURINARY: Bladder non tender, not distended. MUSCULOSKELETAL: No spontaneous movement of extremities, passive movement is very stiff at all joints  NEUROLOGIC: Unable to assess orientation , nonverbal , no tremor.   PSYCHIATRIC:  Withdrawn   ASSESSMENT/PLAN  Diabetes mellitus with chronic kidney disease Recent fasting blood sugars remain elevated, greater than 170. Patient continues to be fed, diet order is regular diet consistency, no concentrated sweets sugar-free  desserts. Increase Lantus insulin to 27 units daily.  Alzheimer's disease Severe stage, total dependence with all activity, nonverbal, minimal contractions. Continue supportive care  Seizures Patient is tolerated low-dose Keppra without adverse effects. No further seizure activity reported.  CKD (chronic kidney disease) stage 3, GFR 30-59 ml/min BUN/creatinine remained stable, stage III.  Hypertension Remains well controlled on low-dose antihypertensive.   Follow up: As needed  Jenah Vanasten T.Janella Rogala, NP-C 08/23/2013

## 2013-08-23 NOTE — Assessment & Plan Note (Signed)
Severe stage, total dependence with all activity, nonverbal, minimal contractions. Continue supportive care

## 2013-08-23 NOTE — Assessment & Plan Note (Signed)
Patient is tolerated low-dose Keppra without adverse effects. No further seizure activity reported.

## 2013-08-27 ENCOUNTER — Encounter: Payer: Self-pay | Admitting: Geriatric Medicine

## 2013-08-27 DIAGNOSIS — I10 Essential (primary) hypertension: Secondary | ICD-10-CM | POA: Insufficient documentation

## 2013-08-27 DIAGNOSIS — E079 Disorder of thyroid, unspecified: Secondary | ICD-10-CM | POA: Insufficient documentation

## 2013-08-27 NOTE — Assessment & Plan Note (Signed)
BUN/creatinine remained stable, stage III.

## 2013-08-27 NOTE — Assessment & Plan Note (Signed)
Remains well controlled on low-dose antihypertensive.

## 2013-10-30 ENCOUNTER — Encounter: Payer: Self-pay | Admitting: Geriatric Medicine

## 2013-10-30 ENCOUNTER — Non-Acute Institutional Stay (SKILLED_NURSING_FACILITY): Payer: Medicare Other | Admitting: Geriatric Medicine

## 2013-10-30 DIAGNOSIS — F0280 Dementia in other diseases classified elsewhere without behavioral disturbance: Secondary | ICD-10-CM

## 2013-10-30 DIAGNOSIS — E1122 Type 2 diabetes mellitus with diabetic chronic kidney disease: Secondary | ICD-10-CM

## 2013-10-30 DIAGNOSIS — R569 Unspecified convulsions: Secondary | ICD-10-CM

## 2013-10-30 DIAGNOSIS — I10 Essential (primary) hypertension: Secondary | ICD-10-CM

## 2013-10-30 DIAGNOSIS — E1129 Type 2 diabetes mellitus with other diabetic kidney complication: Secondary | ICD-10-CM

## 2013-10-30 DIAGNOSIS — N189 Chronic kidney disease, unspecified: Secondary | ICD-10-CM

## 2013-10-30 NOTE — Progress Notes (Signed)
Patient ID: [name], female   DOB: 08-Feb-1929, 79 y.o.   MRN: [id number] [name] (31)  Code Status: DNR. MOST form      Contact Information   Name Relation Home Work [place] Daughter [phone]   McGrane,Cathy Daughter [phone]       Chief Complaint  Patient presents with  . Medical Managment of Chronic Issues    HPI: This is a 79 y.o. female resident of [name] evaluated today for management of ongoing medical issues.    Last visit:  Diabetes mellitus with chronic kidney disease Recent fasting blood sugars remain elevated, greater than 170. Patient continues to be fed, diet order is regular diet consistency, no concentrated sweets sugar-free desserts. Increase Lantus insulin to 27 units daily.  Alzheimer's disease Severe stage, total dependence with all activity, nonverbal, minimal contractions. Continue supportive care  Seizures Patient is tolerated low-dose Keppra without adverse effects. No further seizure activity reported.  CKD (chronic kidney disease) stage 3, GFR 30-59 ml/min BUN/creatinine remained stable, stage III.  Hypertension Remains well controlled on low-dose antihypertensive.  Since last visit there's been no acute medical issues. Review of facility record shows patient's daily fasting blood sugars remain mildly elevated despite increased dose of Lantus. There's been no weight change, vital signs remained stable. Patient has had no seizure activity. There's been no change in patient's cognitive or functional status, shearing means severe stage Alzheimer's disease with total dependence for all activity  Functional Status Bathing: Dependent Bed Mobility: Dependent Bladder Management: Diapers / Pads, Bowel Management: Diapers / Pads Diet / Swallowing: PO Diet: Regular, Feeding: Dependent  Hygiene and  Grooming: Dependent, Toileting / Clothing: Dependent Transfers: Dependent Secondary school teacher)    Allergies  Allergen Reactions  . Percocet [Oxycodone-Acetaminophen]   . Roxicet [Oxycodone-Acetaminophen] Other (See Comments)    Per medical record- unknown reaction   Medications reviewed   DATA REVIEWED  Radiologic Exams   Cardiovascular Exams 9 2007 venous Doppler study  09/13/2011: 2D echocardiogram: EF 55%. In the with mild regurgitation, MA mild dilation. Grade 2 diastolic dysfunction  Laboratory Studies  [facility] lab 11/01/12: WBC 7.3, hgb 12.2, Hct 36.1, PLt 211 Glu 114, BUN 32, Cr. 1.22, Na 143, K+ 4.2. Protein/ LFTs WNL GFR 42 TC 142, TG216, HDL 28, LDL 71 TSH 2.19 A1C 5.8  06/19/2013 glucose 123, BUN 32, creatinine 1.13, sodium 143, potassium 4.4. Protein/LFTs WNL.  Total cholesterol 234, triglyceride 300, HDL 30, LDL 144  TSH 1.73  A1c 6.0  POCT ([facility])  CBG:  Fasting last 6 days  195/149/172/188/178/172       Review of Systems  DATA OBTAINED: from nurse, medical record GENERAL:  No fevers, fatigue, change in appetite or weight SKIN: No itch, rash or open wounds EYES: No eye pain or itching   EARS: No earache, change in hearing NOSE: No congestion, drainage or bleeding MOUTH/THROAT: No mouth or tooth pain          No difficulty chewing or swallowing RESPIRATORY: No cough, wheezing, SOB CARDIAC: No chest pain   No edema. CHEST/BREASTS: No discharge or lumps in breasts GI: No abdominal pain  No N/V/D or constipation   Incontinent GU: No dysuria   No change in urine volume or character   Incontinent   MUSCULOSKELETAL: No joint pain, swelling or stiffness  No back pain   NEUROLOGIC: No fainting, headache  No change in mental status (severe dementia).  PSYCHIATRIC: No signs of anxiety, depression Sleeps well.  No behavior issue.    Physical Exam Filed Vitals:   10/30/13 1710  BP: 155/71  Pulse: 68  Temp: 98.6 F (37 C)  Resp: 16  Weight: 188 lb  (85.276 kg)  SpO2: 98%   Body mass index is 29.44 kg/(m^2).  GENERAL APPEARANCE: No acute distress, appropriately groomed, Overweight body habitus.  Alert, minimally verbal ('thank you"), opens eyes in response to her name, reaches to shake hands. Cannot follow directions SKIN: No diaphoresis, rash, unusual lesions, wounds HEAD: Normocephalic, atraumatic EYES: Conjunctiva/lids clear. Pupils round, reactive. Marland Kitchen  EARS: External exam WNL Hearing grossly normal. NOSE: No deformity or discharge. MOUTH/THROAT: Lips w/o lesions. Oral mucosa, tongue moist, w/o lesion. Oropharynx w/o redness or lesions.  NECK:  No thyroid tenderness, enlargement or nodule LYMPHATICS: No head, neck or supraclavicular adenopathy RESPIRATORY: Breathing is even, unlabored. Lung sounds are clear, shallow   CARDIOVASCULAR: Heart RRR. No murmur or extra heart sounds  ARTERIAL: No carotid bruit.   VENOUS: No varicosities. No venous stasis skin changes  EDEMA: No peripheral or periorbital edema.  GASTROINTESTINAL: Abdomen is obese, soft, non-tender, not distended w/ decreased bowel sounds. No hepatic or splenic enlargement. No mass, ventral or inguinal hernia. GENITOURINARY: Bladder non tender, not distended. MUSCULOSKELETAL: No spontaneous movement of extremities, passive movement is very stiff at all joints  NEUROLOGIC: Unable to assess orientation, no tremor.   PSYCHIATRIC:  Withdrawn   ASSESSMENT/PLAN  Hypertension Blood pressure remains well controlled on low-dose medication. Update labs  Diabetes mellitus with chronic kidney disease Fasting CBGs range from 170-190, no weight change. Check A1c  Alzheimer's disease Unchanged, remain severe stage dependent for all activity. Continue supportive care  CKD (chronic kidney disease) stage 3, GFR 30-59 ml/min Update lab  Seizures No recent seizure activity, continue current medication   Follow up: As needed  Zaineb Nowaczyk T.Taige Housman, NP-C 10/30/2013

## 2013-11-01 NOTE — Assessment & Plan Note (Signed)
Blood pressure remains well controlled on low-dose medication. Update labs

## 2013-11-01 NOTE — Assessment & Plan Note (Signed)
No recent seizure activity, continue current medication 

## 2013-11-01 NOTE — Assessment & Plan Note (Signed)
Update lab 

## 2013-11-01 NOTE — Assessment & Plan Note (Signed)
Fasting CBGs range from 170-190, no weight change. Check A1c

## 2013-11-01 NOTE — Assessment & Plan Note (Signed)
Unchanged, remain severe stage dependent for all activity. Continue supportive care

## 2013-11-06 LAB — CBC AND DIFFERENTIAL
HEMATOCRIT: 35 % — AB (ref 36–46)
HEMOGLOBIN: 12 g/dL (ref 12.0–16.0)
PLATELETS: 216 10*3/uL (ref 150–399)
WBC: 6.6 10^3/mL

## 2013-11-06 LAB — HEPATIC FUNCTION PANEL
ALK PHOS: 62 U/L (ref 25–125)
ALT: 10 U/L (ref 7–35)
AST: 16 U/L (ref 13–35)

## 2013-11-06 LAB — BASIC METABOLIC PANEL
BUN: 26 mg/dL — AB (ref 4–21)
Creatinine: 1 mg/dL (ref 0.5–1.1)
Glucose: 129 mg/dL
Potassium: 4.4 mmol/L (ref 3.4–5.3)
SODIUM: 142 mmol/L (ref 137–147)

## 2013-11-06 LAB — HEMOGLOBIN A1C: HEMOGLOBIN A1C: 6.7 % — AB (ref 4.0–6.0)

## 2014-01-04 ENCOUNTER — Encounter: Payer: Self-pay | Admitting: Geriatric Medicine

## 2014-01-04 ENCOUNTER — Non-Acute Institutional Stay (SKILLED_NURSING_FACILITY): Payer: Medicare Other | Admitting: Geriatric Medicine

## 2014-01-04 DIAGNOSIS — N189 Chronic kidney disease, unspecified: Secondary | ICD-10-CM

## 2014-01-04 DIAGNOSIS — E1129 Type 2 diabetes mellitus with other diabetic kidney complication: Secondary | ICD-10-CM

## 2014-01-04 DIAGNOSIS — N183 Chronic kidney disease, stage 3 unspecified: Secondary | ICD-10-CM

## 2014-01-04 DIAGNOSIS — E1122 Type 2 diabetes mellitus with diabetic chronic kidney disease: Secondary | ICD-10-CM

## 2014-01-04 DIAGNOSIS — F0280 Dementia in other diseases classified elsewhere without behavioral disturbance: Secondary | ICD-10-CM

## 2014-01-04 DIAGNOSIS — G309 Alzheimer's disease, unspecified: Secondary | ICD-10-CM

## 2014-01-04 DIAGNOSIS — R569 Unspecified convulsions: Secondary | ICD-10-CM

## 2014-01-04 NOTE — Progress Notes (Signed)
Patient ID: [name], female   DOB: 04/21/1929, 80 y.o.   MRN: [id number] [name] (31)  Code Status: DNR. MOST form  Contact Information   Name Relation Home Work [place]   Thornton,Amy Daughter [phone]   McGrane,Cathy Daughter [phone]       Chief Complaint  Patient presents with  . Medical Managment of Chronic Issues    HPI: This is a 80 y.o. female resident of [name]  section evaluated today for management of ongoing medical issues.    Last visit:  Hypertension Blood pressure remains well controlled on low-dose medication. Update labs  Diabetes mellitus with chronic kidney disease Fasting CBGs range from 170-190, no weight change. Check A1c  Alzheimer's disease Unchanged, remain severe stage dependent for all activity. Continue supportive care  CKD (chronic kidney disease) stage 3, GFR 30-59 ml/min Update lab  Seizures No recent seizure activity, continue current medication  Since last visit patient has not had any acute medical issues. She continues to remain dependent with little interactions as a result of severe Alzheimer's disease. There's been no seizure activity reported .Recent lab studies all acceptable, recent CBGs satisfactory  as is recent A1c.    Allergies  Allergen Reactions  . Percocet [Oxycodone-Acetaminophen]   . Roxicet [Oxycodone-Acetaminophen] Other (See Comments)    Per medical record- unknown reaction   MEDICATION -  reviewed   DATA REVIEWED  Radiologic Exams   Cardiovascular Exams 9 2007 venous Doppler study  09/13/2011: 2D echocardiogram: EF 55%. In the with mild regurgitation, MA mild dilation. Grade 2 diastolic dysfunction  Laboratory Studies  [facility] lab  06/19/2013 glucose 123, BUN 32, creatinine 1.13, sodium 143, potassium 4.4. Protein/LFTs WNL.  Total cholesterol 234, triglyceride 300,  HDL 30, LDL 144  TSH 1.73  A1c 6.0  Lab Results  Component Value Date   WBC 6.6 11/06/2013   HGB 12.0 11/06/2013   HCT 35* 11/06/2013   MCV 98.0 09/15/2011   PLT 216 11/06/2013   Lab Results  Component Value Date   NA 142 11/06/2013   K 4.4 11/06/2013   GLU 129 11/06/2013   BUN 26* 11/06/2013   CREATININE 1.0 11/06/2013   Albumin 3.6     Lab Results  Component Value Date   HGBA1C 6.7* 11/06/2013         REVIEW OF SYSTEMS DATA OBTAINED: from nurse, medical record GENERAL:  No fevers, fatigue, change in appetite or weight SKIN: No itch, rash or open wounds EYES: No eye pain or itching   EARS: No earache, change in hearing NOSE: No congestion, drainage or bleeding MOUTH/THROAT: No mouth or tooth pain          No difficulty chewing or swallowing RESPIRATORY: No cough, wheezing, SOB CARDIAC: No chest pain   No edema. GI: No abdominal pain  No N/V/D or constipation   Incontinent GU: No dysuria   No change in urine volume or character   Incontinent   MUSCULOSKELETAL: No joint pain, swelling   No back pain   NEUROLOGIC: No fainting, headache  No change in mental status (severe dementia).  PSYCHIATRIC: No signs of anxiety, depression Sleeps well.  No behavior issue.    PHYSICAL EXAM  Filed Vitals:   01/04/14 1614  BP: 103/83  Pulse: 72  Temp: 97.3 F (36.3 C)  Resp: 18  Weight: 190 lb (86.183 kg)   Body mass index is 29.75 kg/(m^2).  GENERAL APPEARANCE: No acute distress,  appropriately groomed, Overweight body habitus.  Alert, non verbal, opens eyes in response to her name,  Cannot follow directions SKIN: No diaphoresis, rash, unusual lesions, wounds HEAD: Normocephalic, atraumatic EYES: Conjunctiva/lids clear. Pupils round, reactive. Marland Kitchen  NOSE: No deformity or discharge. MOUTH/THROAT: Lips w/o lesions. Oral mucosa, tongue moist, w/o lesion. Oropharynx w/o redness or lesions.  NECK:  No thyroid tenderness, enlargement or nodule LYMPHATICS: No head, neck or supraclavicular  adenopathy RESPIRATORY: Breathing is even, unlabored. Lung sounds are clear, shallow   CARDIOVASCULAR: Heart RRR. No murmur or extra heart sounds  EDEMA: No peripheral or periorbital edema.  GASTROINTESTINAL: Abdomen is obese, soft, non-tender, not distended w/ decreased bowel sounds. No hepatic or splenic enlargement. No mass, ventral or inguinal hernia. GENITOURINARY: Bladder non tender, not distended. MUSCULOSKELETAL: No spontaneous movement of extremities, passive movement is very stiff at all joints  NEUROLOGIC: Unable to assess orientation, no tremor.   PSYCHIATRIC:  Withdrawn   ASSESSMENT/PLAN  Diabetes mellitus with chronic kidney disease Most recent A1c less than 7, recent fasting CBGs 170-185, continue current medication,reduce CBG monitoring to 3 times a week  Alzheimer's disease No significant change from last visit, patient is severe stage, little interactions, completely dependent for all activity.  CKD (chronic kidney disease) stage 3, GFR 30-59 ml/min Renal function unchanged, most recent GFR 52.4  Seizures No recent seizure activity, continue current medication   Follow up: Routine or as needed  Toribio Harbour, NP-C [facility]  [phone]  01/04/2014

## 2014-01-07 ENCOUNTER — Encounter: Payer: Self-pay | Admitting: *Deleted

## 2014-01-08 NOTE — Assessment & Plan Note (Signed)
Most recent A1c less than 7, recent fasting CBGs 170-185, continue current medication,reduce CBG monitoring to 3 times a week

## 2014-01-08 NOTE — Assessment & Plan Note (Signed)
Renal function unchanged, most recent GFR 52.4

## 2014-01-08 NOTE — Assessment & Plan Note (Signed)
No significant change from last visit, patient is severe stage, little interactions, completely dependent for all activity.

## 2014-01-08 NOTE — Assessment & Plan Note (Signed)
No recent seizure activity, continue current medication 

## 2014-02-19 ENCOUNTER — Non-Acute Institutional Stay (SKILLED_NURSING_FACILITY): Payer: Medicare Other | Admitting: Geriatric Medicine

## 2014-02-19 ENCOUNTER — Encounter: Payer: Self-pay | Admitting: Geriatric Medicine

## 2014-02-19 DIAGNOSIS — N189 Chronic kidney disease, unspecified: Secondary | ICD-10-CM

## 2014-02-19 DIAGNOSIS — F0280 Dementia in other diseases classified elsewhere without behavioral disturbance: Secondary | ICD-10-CM

## 2014-02-19 DIAGNOSIS — G309 Alzheimer's disease, unspecified: Secondary | ICD-10-CM

## 2014-02-19 DIAGNOSIS — E1129 Type 2 diabetes mellitus with other diabetic kidney complication: Secondary | ICD-10-CM

## 2014-02-19 DIAGNOSIS — R569 Unspecified convulsions: Secondary | ICD-10-CM

## 2014-02-19 DIAGNOSIS — E1122 Type 2 diabetes mellitus with diabetic chronic kidney disease: Secondary | ICD-10-CM

## 2014-02-19 DIAGNOSIS — I10 Essential (primary) hypertension: Secondary | ICD-10-CM

## 2014-02-19 MED ORDER — INSULIN GLARGINE 100 UNIT/ML ~~LOC~~ SOLN: 27.0000 [IU] | Freq: Every day | SUBCUTANEOUS | Status: DC

## 2014-02-19 NOTE — Assessment & Plan Note (Signed)
Remains seizure free on current medication

## 2014-02-19 NOTE — Assessment & Plan Note (Signed)
CBG range last 2 weeks 161-206, weight is up 2 pounds from last month. Increase Lantus dose.

## 2014-02-19 NOTE — Progress Notes (Signed)
Patient ID: Genene ChurnBarbara Stansbery, female   DOB: 12/08/1928, 80 y.o.   MRN: [id number] [name] (31)  Code Status: DNR. MOST form ( reviewed 3 2015)  Contact Information   Name Relation Home Work [place]   Thornton,Amy Daughter [phone]   McGrane,Cathy Daughter [phone]       Chief Complaint  Patient presents with  . Medical Management of Chronic Issues    HPI: This is a 80 y.o. female resident of [name]  section evaluated today for management of ongoing medical issues.    Last visit:  Diabetes mellitus with chronic kidney disease Most recent A1c less than 7, recent fasting CBGs 170-185, continue current medication,reduce CBG monitoring to 3 times a week  Alzheimer's disease No significant change from last visit, patient is severe stage, little interactions, completely dependent for all activity.  CKD (chronic kidney disease) stage 3, GFR 30-59 ml/min Renal function unchanged, most recent GFR 52.4  Seizures No recent seizure activity, continue current medication  Since last visit no acute medical issues.     Allergies  Allergen Reactions  . Percocet [Oxycodone-Acetaminophen]   . Roxicet [Oxycodone-Acetaminophen] Other (See Comments)    Per medical record- unknown reaction   MEDICATION -  reviewed   DATA REVIEWED  Radiologic Exams   Cardiovascular Exams 9 2007 venous Doppler study  09/13/2011: 2D echocardiogram: EF 55%. In the with mild regurgitation, MA mild dilation. Grade 2 diastolic dysfunction  Laboratory Studies  [facility] lab  06/19/2013 glucose 123, BUN 32, creatinine 1.13, sodium 143, potassium 4.4. Protein/LFTs WNL.  Total cholesterol 234, triglyceride 300, HDL 30, LDL 144  TSH 1.73  A1c 6.0  Lab Results  Component Value Date   WBC 6.6 11/06/2013   HGB 12.0 11/06/2013   HCT 35* 11/06/2013   MCV 98.0 09/15/2011   PLT 216  11/06/2013   Lab Results  Component Value Date   NA 142 11/06/2013   K 4.4 11/06/2013   GLU 129 11/06/2013   BUN 26* 11/06/2013   CREATININE 1.0 11/06/2013   Albumin 3.6     Lab Results  Component Value Date   HGBA1C 6.7* 11/06/2013         REVIEW OF SYSTEMS DATA OBTAINED: from nurse, medical record GENERAL:  No fevers, fatigue, change in appetite or weight. Eats greater than 75% of all meals, is fed  SKIN: No itch, rash or open wounds EYES: No eye pain or itching   EARS: No earache, change in hearing NOSE: No congestion, drainage or bleeding MOUTH/THROAT: No mouth or tooth pain          No difficulty chewing or swallowing RESPIRATORY: No cough, wheezing, SOB CARDIAC: No chest pain   No edema. GI: No abdominal pain  No N/V/D or constipation   Incontinent GU: No dysuria   No change in urine volume or character   Incontinent   MUSCULOSKELETAL: No joint pain, swelling   No back pain   NEUROLOGIC: No fainting, headache  No change in mental status (severe dementia).  PSYCHIATRIC: No signs of anxiety, depression Sleeps well.  No behavior issue.    PHYSICAL EXAM  Filed Vitals:   02/19/14 1548  BP: 121/76  Pulse: 74  Temp: 98 F (36.7 C)  Resp: 16  Weight: 192 lb (87.091 kg)   Body mass index is 30.06 kg/(m^2).  GENERAL APPEARANCE: No acute distress, appropriately groomed, Overweight body habitus.  Alert, responsive minimal verbalization today, and follow very  simple direction  SKIN: No diaphoresis, rash, unusual lesions, wounds HEAD: Normocephalic, atraumatic EYES: Conjunctiva/lids clear. Pupils round, reactive. Marland Kitchen.  NOSE: No deformity or discharge. MOUTH/THROAT: Lips w/o lesions. Oral mucosa, tongue moist, w/o lesion. Oropharynx w/o redness or lesions.  NECK:  No thyroid tenderness, enlargement or nodule LYMPHATICS: No head, neck or supraclavicular adenopathy RESPIRATORY: Breathing is even, unlabored. Anterior lung sounds are clear, shallow   CARDIOVASCULAR: Heart RRR. No murmur or  extra heart sounds  EDEMA: No peripheral or periorbital edema.  GASTROINTESTINAL: Abdomen is obese, soft, non-tender, not distended w/ decreased bowel sounds.  MUSCULOSKELETAL: No spontaneous movement of extremities, passive movement is very stiff at all joints  NEUROLOGIC: Unable to assess orientation, no tremor.   PSYCHIATRIC:  Engaged in visit today  ASSESSMENT/PLAN  Hypertension Last 3 weekly Blood pressure readings satisfactory: 119-143/70-76,  Pulse 74-82. Most recent lab satisfactory. Continue current medication   Diabetes mellitus with chronic kidney disease CBG range last 2 weeks 161-206, weight is up 2 pounds from last month. Increase Lantus dose.   Alzheimer's disease Last quarterly care plan and MDS review 01/11/2014, no significant changes, no issues identified. Patient continues to be completely dependent for all ADLs, severely demented  Seizures Remains seizure free on current medication   Follow up: Routine or as needed  Toribio HarbourClaudette T.Jozlyn Schatz, NP-C [facility]  [phone]  02/19/2014

## 2014-02-19 NOTE — Assessment & Plan Note (Signed)
Last 3 weekly Blood pressure readings satisfactory: 119-143/70-76,  Pulse 74-82. Most recent lab satisfactory. Continue current medication

## 2014-02-19 NOTE — Assessment & Plan Note (Signed)
Last quarterly care plan and MDS review 01/11/2014, no significant changes, no issues identified. Patient continues to be completely dependent for all ADLs, severely demented

## 2014-03-04 ENCOUNTER — Non-Acute Institutional Stay (SKILLED_NURSING_FACILITY): Payer: Medicare Other | Admitting: Geriatric Medicine

## 2014-03-04 ENCOUNTER — Encounter: Payer: Self-pay | Admitting: Geriatric Medicine

## 2014-03-04 DIAGNOSIS — H109 Unspecified conjunctivitis: Secondary | ICD-10-CM

## 2014-03-04 DIAGNOSIS — R1314 Dysphagia, pharyngoesophageal phase: Secondary | ICD-10-CM | POA: Insufficient documentation

## 2014-03-04 MED ORDER — ERYTHROMYCIN 5 MG/GM OP OINT: 1.0000 "application " | TOPICAL_OINTMENT | Freq: Three times a day (TID) | OPHTHALMIC | Status: AC

## 2014-03-04 NOTE — Assessment & Plan Note (Signed)
Significant choking episode 2 nights ago while eating stew. No signs of respiratory distress at this time. Tolerating mechanical soft diet, is fed.  Continue close monitoring with all meals.

## 2014-03-04 NOTE — Assessment & Plan Note (Signed)
Have a significant purulent drainage. Start erythromycin ointment x5 days

## 2014-03-04 NOTE — Progress Notes (Signed)
Patient ID: Genene ChurnBarbara Olivarez, female   DOB: 05/03/1929, 80 y.o.   MRN: [id number]   [facility] [phone](31)   Contact Information   Name Relation Home Work [place]   Thornton,Amy Daughter [phone]   McGrane,Cathy Daughter [phone]       Chief Complaint  Patient presents with  . Choking episode    HPI: This is a 80 y.[facility]. [facility].  Evaluated today in followup of choking episode 03/01/2014. Her C. difficile on call in the evening, Nurse reported this patient was in the dining room eating stew beef she suddenly "became choked". She's having difficulty moving air, colored her face color became very red there was thick mucus noted in her mouth. Her mouth and oropharynx were suctioned to remove thick white mucus, patient was able to clear her airway. She was able to speak. A chest x-ray was ordered, as well as n.p.o. status for the rest of the evening. Recommended diet change to mechanical soft. Vital signs remained stable over the weekend. Chest x-ray was returned without obvious signs of aspiration. Patient has had no further choking episodes, continues to be fed a mechanical soft diet. Vital signs have been stable.    Allergies  Allergen Reactions  . Percocet [Oxycodone-Acetaminophen]   . Roxicet [Oxycodone-Acetaminophen] Other (See Comments)    Per medical record- unknown reaction    MEDICATIONS -  Reviewed  DATA REVIEWED  Radiologic Exams:   [facility] 03/01/2014 Chest x-ray: Enlarged heart with elevated pulmonary vascular chair suggest congestive heart failure. Very fine interstitial changes primarily in the lower lungs most consistent with interstitial pulmonary edema. Pneumonia could produce a similar picture.  Laboratory Studies:   REVIEW OF SYSTEMS  DATA OBTAINED: from patient, nurse, medical record GENERAL: Feels well  No  fever, fatigue, change in activity status, appetite, or weight  RESPIRATORY: No cough, wheezing, SOB CARDIAC: No chest pain, palpitations. No edema GI: No abdominal pain  No Nausea,vomiting,diarrhea or constipation. Tolerating mechanical soft diet NEUROLOGIC: No dizziness, fainting, headache,  No change in mental status (severe dementia) PSYCHIATRIC: No sign of anxiety, depression  Sleeps well   No behavior issue   PHYSICAL EXAM Filed Vitals:   03/04/14 1655  BP: 131/77  Pulse: 68  Temp: 98 F (36.7 C)  Resp: 16  Weight: 195 lb 9.6 oz (88.724 kg)  SpO2: 96%   Body mass index is 30.63 kg/(m^2). GENERAL APPEARANCE: No acute distress, appropriately groomed, normal body habitus  Alert, pleasant, nonverbal today  SKIN: No diaphoresis, rash, wound HEAD: Normocephalic, atraumatic EYES: Left eye with purulent drainage, conjunctiva mildly irritated RESPIRATORY: Breathing is even, unlabored  Lung sounds are clear and full O2 saturation 97%RA CARDIOVASCULAR: Heart RRR   No murmur or extra heart sounds   EDEMA: No peripheral edema   PSYCHIATRIC: Mood and affect appropriate to situation    ASSESSMENT/PLAN  Dysphagia, pharyngoesophageal phase Significant choking episode 2 nights ago while eating stew. No signs of respiratory distress at this time. Tolerating mechanical soft diet, is fed.  Continue close monitoring with all meals.  Conjunctivitis Have a significant purulent drainage. Start erythromycin ointment x5 days    Family/ staff Communication:     Labs/tests ordered:    Follow up: Return for Routine/as needed.  Toribio Harbourlaudette T.Destynie Toomey, NP-C [facility] [phone]  03/04/2014

## 2014-04-05 ENCOUNTER — Non-Acute Institutional Stay (SKILLED_NURSING_FACILITY): Payer: Medicare Other | Admitting: Geriatric Medicine

## 2014-04-05 ENCOUNTER — Encounter: Payer: Self-pay | Admitting: Geriatric Medicine

## 2014-04-05 DIAGNOSIS — G309 Alzheimer's disease, unspecified: Secondary | ICD-10-CM

## 2014-04-05 DIAGNOSIS — E1122 Type 2 diabetes mellitus with diabetic chronic kidney disease: Secondary | ICD-10-CM

## 2014-04-05 DIAGNOSIS — F0280 Dementia in other diseases classified elsewhere without behavioral disturbance: Secondary | ICD-10-CM

## 2014-04-05 DIAGNOSIS — E1129 Type 2 diabetes mellitus with other diabetic kidney complication: Secondary | ICD-10-CM

## 2014-04-05 DIAGNOSIS — I10 Essential (primary) hypertension: Secondary | ICD-10-CM

## 2014-04-05 DIAGNOSIS — H109 Unspecified conjunctivitis: Secondary | ICD-10-CM

## 2014-04-05 DIAGNOSIS — N189 Chronic kidney disease, unspecified: Secondary | ICD-10-CM

## 2014-04-05 DIAGNOSIS — R1314 Dysphagia, pharyngoesophageal phase: Secondary | ICD-10-CM

## 2014-04-05 MED ORDER — INSULIN GLARGINE 100 UNIT/ML ~~LOC~~ SOLN: 32.0000 [IU] | Freq: Every day | SUBCUTANEOUS | Status: AC

## 2014-04-05 NOTE — Assessment & Plan Note (Signed)
resolved 

## 2014-04-05 NOTE — Progress Notes (Signed)
Patient ID: [name], female   DOB: 07/18/1929, 80 y.o.   MRN: [id number]   [facility] [phone])   Contact Information   Name Relation Home Work [place]   Thornton,Amy Daughter [phone]   McGrane,Cathy Daughter [phone]       Chief Complaint  Patient presents with  . Medical Management of Chronic Issues    HPI: This is a 80 y.o. female resident of [name] evaluated today for management of ongoing medical issues.    Recent visits: Dysphagia, pharyngoesophageal phase Significant choking episode 2 nights ago while eating stew. No signs of respiratory distress at this time. Tolerating mechanical soft diet, is fed.  Continue close monitoring with all meals. Conjunctivitis Have a significant purulent drainage. Start erythromycin ointment x5 days Hypertension Last 3 weekly Blood pressure readings satisfactory: 119-143/70-76,  Pulse 74-82. Most recent lab satisfactory. Continue current medication Diabetes mellitus with chronic kidney disease CBG range last 2 weeks 161-206, weight is up 2 pounds from last month. Increase Lantus dose. Alzheimer's disease Last quarterly care plan and MDS review 01/11/2014, no significant changes, no issues identified. Patient continues to be completely dependent for all ADLs, severely demented Seizures Remains seizure free on current medication  Since last visit ST evaluated patient's swallow function. Modified consistency diet recommended and implemented. No further choking episodes, continues to consume 100% of meals (is fed.) Conjunctivitis resolved without incident Review facility record shows stable vital signs and weight. Fasting blood sugars consistently greater than 160.    Allergies  Allergen Reactions  . Percocet [Oxycodone-Acetaminophen]   . Roxicet [Oxycodone-Acetaminophen] Other (See Comments)    Per medical  record- unknown reaction    MEDICATIONS -  Reviewed  DATA REVIEWED  Radiologic Exams:   [facility] 03/01/2014 Chest x-ray: Enlarged heart with elevated pulmonary vascular chair suggest congestive heart failure. Very fine interstitial changes primarily in the lower lungs most consistent with interstitial pulmonary edema. Pneumonia could produce a similar picture.  Laboratory Studies: Lab Results  Component Value Date   WBC 6.6 11/06/2013   HGB 12.0 11/06/2013   HCT 35* 11/06/2013   PLT 216 11/06/2013   Lab Results  Component Value Date   NA 142 11/06/2013   K 4.4 11/06/2013   GLU 129 11/06/2013   BUN 26* 11/06/2013   CREATININE 1.0 11/06/2013    Lab Results  Component Value Date   HGBA1C 6.7* 11/06/2013   Lab Results  Component Value Date   TSH 1.73 06/19/2013     REVIEW OF SYSTEMS  DATA OBTAINED: from  nurse, medical record - nonverbal GENERAL:  No fever, change in activity status, appetite, or weight  RESPIRATORY: No cough, wheezing, SOB CARDIAC: No chest pain, palpitations. No edema GI: No abdominal pain  No Nausea,vomiting,diarrhea or constipation. Tolerating mechanical soft diet NEUROLOGIC: No dizziness, fainting, headache,  No change in mental status (severe dementia) PSYCHIATRIC: No sign of anxiety, depression  Sleeps well   No behavior issue   PHYSICAL EXAM Filed Vitals:   04/05/14 1200  BP: 107/68  Pulse: 73  Temp: 97 F (36.1 C)  TempSrc: Oral  Resp: 18  Weight: 195 lb (88.451 kg)  SpO2: 94%   Body mass index is 30.53 kg/(m^2). GENERAL APPEARANCE: No acute distress, appropriately groomed, normal body habitus  Alert, pleasant, nonverbal today  SKIN: No diaphoresis, rash, wound HEAD: Normocephalic, atraumatic EYES: Conjunctiva/lids clear. Pupils round, reactive. EOMs intact.  RESPIRATORY: Breathing is even, unlabored  Lung  sounds are clear and full CARDIOVASCULAR: Heart RRR   No murmur or extra heart sounds   EDEMA: No peripheral edema   PSYCHIATRIC:  Mood and affect appropriate to situation    ASSESSMENT/PLAN  Diabetes mellitus with chronic kidney disease Fasting CBGs remain too high, 168-199, increase Lantus  Hypertension Blood pressure readings remain satisfactory, continue current medication.  Dysphagia, pharyngoesophageal phase Progressive dysphagia related to severe Alzheimer's dementia. Continue current modified consistency diet, continue to monitor for coughing or choking with meals  Alzheimer's disease Quarterly MDS review in process, no significant change. Severe stage of this disease  Conjunctivitis resolved   Family/ staff Communication:     Labs/tests ordered:    Follow up: Return for Routine/as needed.  Toribio Harbour, NP-C [facility] [phone]  04/05/2014

## 2014-04-05 NOTE — Assessment & Plan Note (Signed)
Progressive dysphagia related to severe Alzheimer's dementia. Continue current modified consistency diet, continue to monitor for coughing or choking with meals

## 2014-04-05 NOTE — Assessment & Plan Note (Signed)
Fasting CBGs remain too high, 168-199, increase Lantus

## 2014-04-05 NOTE — Assessment & Plan Note (Signed)
Blood pressure readings remain satisfactory, continue current medication.

## 2014-04-05 NOTE — Assessment & Plan Note (Signed)
Quarterly MDS review in process, no significant change. Severe stage of this disease

## 2014-05-28 ENCOUNTER — Encounter: Payer: Self-pay | Admitting: Nurse Practitioner

## 2014-05-28 ENCOUNTER — Non-Acute Institutional Stay (SKILLED_NURSING_FACILITY): Payer: Medicare Other | Admitting: Nurse Practitioner

## 2014-05-28 DIAGNOSIS — N183 Chronic kidney disease, stage 3 unspecified: Secondary | ICD-10-CM

## 2014-05-28 DIAGNOSIS — E079 Disorder of thyroid, unspecified: Secondary | ICD-10-CM

## 2014-05-28 DIAGNOSIS — I10 Essential (primary) hypertension: Secondary | ICD-10-CM

## 2014-05-28 DIAGNOSIS — F0280 Dementia in other diseases classified elsewhere without behavioral disturbance: Secondary | ICD-10-CM

## 2014-05-28 DIAGNOSIS — E1129 Type 2 diabetes mellitus with other diabetic kidney complication: Secondary | ICD-10-CM

## 2014-05-28 DIAGNOSIS — R569 Unspecified convulsions: Secondary | ICD-10-CM

## 2014-05-28 DIAGNOSIS — E1122 Type 2 diabetes mellitus with diabetic chronic kidney disease: Secondary | ICD-10-CM

## 2014-05-28 DIAGNOSIS — G309 Alzheimer's disease, unspecified: Secondary | ICD-10-CM

## 2014-05-28 DIAGNOSIS — N189 Chronic kidney disease, unspecified: Secondary | ICD-10-CM

## 2014-05-28 NOTE — Progress Notes (Signed)
Patient ID: [name], female   DOB: 08-16-1929, 80 y.o.   MRN: [id number]   [facility] SNF [phone]   Contact Information   Name Relation Home Work [place] Daughter [phone]   McGrane,Cathy Daughter [phone]       Chief Complaint  Patient presents with  . Medical Management of Chronic Issues    HPI: This is a 80 y.o. female resident of [name], Skilled Nursing section evaluated today for management of ongoing medical issues.  Pt with a pmh of dysphagia, htn, DM, Dementia, seizures. Without any significant changes in the last month, requiring feeding new order to stop after 30 mins. conts ground meats and thin liquids. Staff without any concerns at this time.    Allergies  Allergen Reactions  . Percocet [Oxycodone-Acetaminophen]   . Roxicet [Oxycodone-Acetaminophen] Other (See Comments)    Per medical record- unknown reaction    MEDICATIONS - Current Outpatient Prescriptions on File Prior to Visit  Medication Sig Dispense Refill  . acetaminophen (TYLENOL) 500 MG tablet Take 500 mg by mouth 2 (two) times daily. For pain      . aspirin 81 MG chewable tablet Chew 81 mg by mouth every morning. For CVT      . docusate sodium (COLACE) 100 MG capsule Take 100 mg by mouth at bedtime. For constipation      . insulin glargine (LANTUS) 100 UNIT/ML injection Inject 0.32 mLs (32 Units total) into the skin at bedtime. For diabetes      . levETIRAcetam (KEPPRA) 250 MG tablet Take 1 tablet (250 mg total) by mouth every 12 (twelve) hours.      Marland Kitchen levothyroxine (SYNTHROID, LEVOTHROID) 125 MCG tablet Take 125 mcg by mouth daily before breakfast. For thyroid therapy      . lisinopril (PRINIVIL,ZESTRIL) 5 MG tablet Take 5 mg by mouth daily. For CHF      . polyethylene glycol (MIRALAX / GLYCOLAX) packet Take 17 g by mouth daily. For constipation       No current  facility-administered medications on file prior to visit.     DATA REVIEWED  Radiologic Exams:   Quality mobile x-ray 03/01/2014 Chest x-ray: Enlarged heart with elevated pulmonary vascular chair suggest congestive heart failure. Very fine interstitial changes primarily in the lower lungs most consistent with interstitial pulmonary edema. Pneumonia could produce a similar picture.  Laboratory Studies: Lab Results  Component Value Date   WBC 6.6 11/06/2013   HGB 12.0 11/06/2013   HCT 35* 11/06/2013   PLT 216 11/06/2013   Lab Results  Component Value Date   NA 142 11/06/2013   K 4.4 11/06/2013   GLU 129 11/06/2013   BUN 26* 11/06/2013   CREATININE 1.0 11/06/2013    Lab Results  Component Value Date   HGBA1C 6.7* 11/06/2013   Lab Results  Component Value Date   TSH 1.73 06/19/2013     REVIEW OF SYSTEMS  DATA OBTAINED: from  nurse, medical record - nonverbal GENERAL:  No fever, change in activity status, appetite, or weight  RESPIRATORY: No cough, wheezing, SOB CARDIAC: No chest pain,No edema GI: No abdominal pain  No Nausea,vomiting,diarrhea or constipation. Tolerating mechanical soft diet NEUROLOGIC: No change in mental status (severe dementia) PSYCHIATRIC: No sign of anxiety, depression  Sleeps well   No behavior issue   PHYSICAL EXAM Filed Vitals:   05/28/14 1013  BP: 138/82  Pulse: 80  Temp: 98 F (36.7 C)  Resp: 20  Weight: 192 lb (87.091 kg)  SpO2: 94%   Body mass index is 30.06 kg/(m^2). GENERAL APPEARANCE: No acute distress, appropriately groomed, normal body habitus.  SKIN: No diaphoresis, rash, wound HEAD: Normocephalic, atraumatic EYES: Conjunctiva/lids clear. Pupils round, reactive. EOMs intact.  RESPIRATORY: Breathing is even, unlabored  Lung sounds are clear and full CARDIOVASCULAR: Heart RRR   No murmur or extra heart sounds   EDEMA: No peripheral edema   GASTROINTESTINAL: Abdomen is soft, non-tender, not distended w/ normal bowel sounds.  MUSCULOSKELETAL:  sits in WC NEUROLOGIC: alert, non-verbal,  no tremor. PSYCHIATRIC: severe dementia   ASSESSMENT/PLAN  1. Type 2 diabetes mellitus with diabetic chronic kidney disease Blood sugars checked occasionally, no hypoglycemia episodes noted, ranging from 145-215, will follow up A1c   2. CKD (chronic kidney disease) stage 3, GFR 30-59 ml/min -will follow up CMP  3. Alzheimer's disease -advanced, requiring total care  4. Essential hypertension -stable at this time on lisinopril, conts asa  5. Thyroid disease On synthroid 125 mcg, will follow up TSH  6. Seizures -conts on keppra, no recent seizures noted.

## 2014-06-08 LAB — HEPATIC FUNCTION PANEL
ALK PHOS: 62 U/L (ref 25–125)
ALT: 12 U/L (ref 7–35)
AST: 14 U/L (ref 13–35)
Bilirubin, Total: 0.4 mg/dL

## 2014-06-08 LAB — BASIC METABOLIC PANEL
BUN: 27 mg/dL — AB (ref 4–21)
CREATININE: 1.1 mg/dL (ref 0.5–1.1)
Glucose: 159 mg/dL
POTASSIUM: 4.4 mmol/L (ref 3.4–5.3)
Sodium: 138 mmol/L (ref 137–147)

## 2014-06-08 LAB — CBC AND DIFFERENTIAL
HCT: 35 % — AB (ref 36–46)
Hemoglobin: 11.8 g/dL — AB (ref 12.0–16.0)
PLATELETS: 217 10*3/uL (ref 150–399)
WBC: 6.6 10^3/mL

## 2014-06-08 LAB — HEMOGLOBIN A1C: HEMOGLOBIN A1C: 6.9 % — AB (ref 4.0–6.0)

## 2014-06-08 LAB — TSH: TSH: 4.11 u[IU]/mL (ref 0.41–5.90)

## 2014-07-16 ENCOUNTER — Non-Acute Institutional Stay (SKILLED_NURSING_FACILITY): Payer: Medicare Other | Admitting: Nurse Practitioner

## 2014-07-16 DIAGNOSIS — R569 Unspecified convulsions: Secondary | ICD-10-CM

## 2014-07-16 DIAGNOSIS — N189 Chronic kidney disease, unspecified: Secondary | ICD-10-CM

## 2014-07-16 DIAGNOSIS — E1122 Type 2 diabetes mellitus with diabetic chronic kidney disease: Secondary | ICD-10-CM

## 2014-07-16 DIAGNOSIS — N183 Chronic kidney disease, stage 3 unspecified: Secondary | ICD-10-CM

## 2014-07-16 DIAGNOSIS — E079 Disorder of thyroid, unspecified: Secondary | ICD-10-CM

## 2014-07-16 DIAGNOSIS — E1129 Type 2 diabetes mellitus with other diabetic kidney complication: Secondary | ICD-10-CM

## 2014-07-16 DIAGNOSIS — I10 Essential (primary) hypertension: Secondary | ICD-10-CM

## 2014-07-16 DIAGNOSIS — G309 Alzheimer's disease, unspecified: Secondary | ICD-10-CM

## 2014-07-16 DIAGNOSIS — F0280 Dementia in other diseases classified elsewhere without behavioral disturbance: Secondary | ICD-10-CM

## 2014-07-16 NOTE — Progress Notes (Signed)
Patient ID: [name], female   DOB: 1929/01/30, 80 y.o.   MRN: [id number]   [facility] SN[facility] [phone]   Contact Information   Name Relation Home Work [place] Daughter [phone]   McGrane,Cathy Daughter [phone]       Chief Complaint  Patient presents with  . Medical Management of Chronic Issues    HPI: This is a 80 y.o. female resident of [name], Skilled Nursing section evaluated today for management of ongoing medical issues.  Pt with a pmh of dysphagia, htn, diabetes, dementia, and seizures. There has been no major changes in the last month. Labs updated and reviewed without significant changes.    Allergies  Allergen Reactions  . Percocet [Oxycodone-Acetaminophen]   . Roxicet [Oxycodone-Acetaminophen] Other (See Comments)    Per medical record- unknown reaction    MEDICATIONS - Current Outpatient Prescriptions on File Prior to Visit  Medication Sig Dispense Refill  . acetaminophen (TYLENOL) 500 MG tablet Take 500 mg by mouth 2 (two) times daily. For pain      . aspirin 81 MG chewable tablet Chew 81 mg by mouth every morning. For CVT      . docusate sodium (COLACE) 100 MG capsule Take 100 mg by mouth at bedtime. For constipation      . insulin glargine (LANTUS) 100 UNIT/ML injection Inject 0.32 mLs (32 Units total) into the skin at bedtime. For diabetes      . levETIRAcetam (KEPPRA) 250 MG tablet Take 1 tablet (250 mg total) by mouth every 12 (twelve) hours.      Marland Kitchen levothyroxine (SYNTHROID, LEVOTHROID) 125 MCG tablet Take 125 mcg by mouth daily before breakfast. For thyroid therapy      . lisinopril (PRINIVIL,ZESTRIL) 5 MG tablet Take 5 mg by mouth daily. For CHF      . polyethylene glycol (MIRALAX / GLYCOLAX) packet Take 17 g by mouth daily. For constipation       No current facility-administered medications on file prior to visit.     DATA  REVIEWED  Radiologic Exams:   Quality mobile x-ray 03/01/2014 Chest x-ray: Enlarged heart with elevated pulmonary vascular chair suggest congestive heart failure. Very fine interstitial changes primarily in the lower lungs most consistent with interstitial pulmonary edema. Pneumonia could produce a similar picture.  Laboratory Studies: Lab Results  Component Value Date   WBC 6.6 11/06/2013   HGB 12.0 11/06/2013   HCT 35* 11/06/2013   PLT 216 11/06/2013   Lab Results  Component Value Date   NA 138 06/08/2014   K 4.4 06/08/2014   GLU 159 06/08/2014   BUN 27* 06/08/2014   CREATININE 1.1 06/08/2014    Lab Results  Component Value Date   HGBA1C 6.9* 06/08/2014   Lab Results  Component Value Date   TSH 4.11 06/08/2014     REVIEW OF SYSTEMS  Unable to assess ROS due to advanced dementia and being non-verbal   PHYSICAL EXAM Filed Vitals:   07/16/14 1419  BP: 132/80  Pulse: 68  Temp: 97.8 F (36.6 C)  Resp: 18  Weight: 198 lb 12.8 oz (90.175 kg)   Body mass index is 31.13 kg/(m^2). GENERAL APPEARANCE: No acute distress, appropriately groomed, normal body habitus.  SKIN: No diaphoresis, rash, wound HEAD: Normocephalic, atraumatic EYES: Conjunctiva/lids clear. Pupils round, reactive. EOMs intact.  RESPIRATORY: Breathing is even, unlabored  Lung sounds are clear and full CARDIOVASCULAR: Heart RRR   No murmur or extra heart sounds  EDEMA: No peripheral edema   GASTROINTESTINAL: Abdomen is soft, non-tender, not distended w/ normal bowel sounds.  MUSCULOSKELETAL: sits in WC NEUROLOGIC: alert, non-verbal,  no tremor. PSYCHIATRIC: severe dementia   ASSESSMENT/PLAN  1. Type 2 diabetes mellitus with diabetic chronic kidney disease A1c at goal, conts to eat well, no hypoglycemic episodes, will cont current medications  2. CKD (chronic kidney disease) stage 3, GFR 30-59 ml/min -renal function stable, recent lab reviewed   3. Alzheimer's disease -unchanged without changes in the last  month  4. Essential hypertension -stable at this time on lisinopril, conts asa  5. Thyroid disease On synthroid 125 mcg, TSH reviewed within acceptable range   6. Seizures -conts on keppra, no recent seizures noted.

## 2014-07-26 ENCOUNTER — Encounter: Payer: Self-pay | Admitting: Internal Medicine

## 2014-08-26 ENCOUNTER — Non-Acute Institutional Stay (SKILLED_NURSING_FACILITY): Payer: Medicare Other | Admitting: Nurse Practitioner

## 2014-08-26 DIAGNOSIS — F0280 Dementia in other diseases classified elsewhere without behavioral disturbance: Secondary | ICD-10-CM

## 2014-08-26 DIAGNOSIS — R1314 Dysphagia, pharyngoesophageal phase: Secondary | ICD-10-CM

## 2014-08-26 DIAGNOSIS — E1122 Type 2 diabetes mellitus with diabetic chronic kidney disease: Secondary | ICD-10-CM

## 2014-08-26 DIAGNOSIS — G309 Alzheimer's disease, unspecified: Secondary | ICD-10-CM

## 2014-08-26 DIAGNOSIS — R569 Unspecified convulsions: Secondary | ICD-10-CM

## 2014-08-26 DIAGNOSIS — I10 Essential (primary) hypertension: Secondary | ICD-10-CM

## 2014-08-26 DIAGNOSIS — N189 Chronic kidney disease, unspecified: Secondary | ICD-10-CM

## 2014-08-26 NOTE — Progress Notes (Signed)
Patient ID: Maria ChurnBarbara Kloeppel, female   DOB: 07/25/1929, 80 y.o.   MRN: [id number]    Nursing Home Location:  [name]   Place of Service: SNF (31)  PCP: Kimber RelicGREEN, ARTHUR G, MD  Allergies  Allergen Reactions  . Percocet [Oxycodone-Acetaminophen]   . Roxicet [Oxycodone-Acetaminophen] Other (See Comments)    Per medical record- unknown reaction    Chief Complaint  Patient presents with  . Medical Management of Chronic Issues    HPI:  Patient is a 80 y.o. [facility] today for follow up on routine conditions. Pt with a pmh of dysphagia, htn, diabetes, dementia, and seizures. Nursing does not have any concerns at this time. No acute issues over the last month.   Review of Systems:  Review of Systems  Unable to perform ROS: Dementia    Past Medical History  Diagnosis Date  . Hypertension   . DEMENTIA   . Thyroid disease   . Diabetes mellitus   . Hyperlipidemia   . Obesity, unspecified 2006  . Depression 2009  . Depressive disorder, not elsewhere classified   . Alzheimer's disease 2007  . External hemorrhoids without mention of complication 2010  . Chronic kidney disease, stage II (mild) 2010  . Lump or mass in breast 2010  . Spinal stenosis, unspecified region other than cervical 2007    s/p surgery 2007  . Senile osteoporosis 2006  . Transient ischemic attack (TIA), and cerebral infarction without residual deficits(V12.54) 09/2011  . Seizures 04/06/2013  . 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 CXR neagative for Tb   Past Surgical History  Procedure Laterality Date  . Thyroidectomy    . Back surgery  1970  . Thyroidectomy  1966  . Abdominal hysterectomy  1966  . Lumbar laminectomy  9 2007    Central laminectomy L3-4 w/bilateral L3-4 nerve root decompression. Right transforaminal fusion L3-4 w/ 10mm lordotic leopard cage/ local bone graft. Posterolateral L3-4 fusion w/  local/ allograft Symphony bone graft. Pedicle screw/rod fixation L3-4.     Social History:   reports that she has never smoked. She has never used smokeless tobacco. She reports that she does not drink alcohol or use illicit drugs.  Family History  Problem Relation Age of Onset  . Diabetes type II Other     Medications: Patient's Medications  New Prescriptions   No medications on file  Previous Medications   ACETAMINOPHEN (TYLENOL) 500 MG TABLET    Take 500 mg by mouth 2 (two) times daily. For pain   ASPIRIN 81 MG CHEWABLE TABLET    Chew 81 mg by mouth every morning. For CVT   DOCUSATE SODIUM (COLACE) 100 MG CAPSULE    Take 100 mg by mouth at bedtime. For constipation   INSULIN GLARGINE (LANTUS) 100 UNIT/ML INJECTION    Inject 0.32 mLs (32 Units total) into the skin at bedtime. For diabetes   LEVETIRACETAM (KEPPRA) 250 MG TABLET    Take 1 tablet (250 mg total) by mouth every 12 (twelve) hours.   LEVOTHYROXINE (SYNTHROID, LEVOTHROID) 125 MCG TABLET    Take 125 mcg by mouth daily before breakfast. For thyroid therapy   LISINOPRIL (PRINIVIL,ZESTRIL) 5 MG TABLET    Take 5 mg by mouth daily. For CHF   POLYETHYLENE GLYCOL (MIRALAX / GLYCOLAX) PACKET    Take 17 g by mouth daily. For constipation  Modified Medications   No medications on file  Discontinued Medications  No medications on file     Physical Exam: Filed Vitals:   08/26/14 1703  BP: 142/88  Pulse: 68  Temp: 98.1 F (36.7 C)  TempSrc: Oral  Resp: 20    Physical Exam  Constitutional: She appears well-developed and well-nourished. No distress.  HENT:  Head: Normocephalic and atraumatic.  Mouth/Throat: Oropharynx is clear and moist. No oropharyngeal exudate.  Eyes: Conjunctivae are normal. Pupils are equal, round, and reactive to light.  Neck: Normal range of motion. Neck supple.  Cardiovascular: Normal rate, regular rhythm and normal heart sounds.   Pulmonary/Chest: Effort normal and breath sounds normal.    Abdominal: Soft. Bowel sounds are normal.  Musculoskeletal: She exhibits no edema and no tenderness.  Limited ROM to upper and lower extremities.   Neurological: She is alert.  Nonverbal, advanced dementia  Skin: Skin is warm and dry. She is not diaphoretic.  Psychiatric: She has a normal mood and affect.    Labs reviewed: Basic Metabolic Panel:  Recent Labs  16/08/9600/06/15 06/08/14  NA 142 138  K 4.4 4.4  BUN 26* 27*  CREATININE 1.0 1.1   Liver Function Tests:  Recent Labs  11/06/13 06/08/14  AST 16 14  ALT 10 12  ALKPHOS 62 62   No results found for this basename: LIPASE, AMYLASE,  in the last 8760 hours No results found for this basename: AMMONIA,  in the last 8760 hours CBC:  Recent Labs  11/06/13 06/08/14  WBC 6.6 6.6  HGB 12.0 11.8*  HCT 35* 35*  PLT 216 217   TSH:  Recent Labs  06/08/14  TSH 4.11   A1C: Lab Results  Component Value Date   HGBA1C 6.9* 06/08/2014   Lipid Panel: No results found for this basename: CHOL, HDL, LDLCALC, TRIG, CHOLHDL, LDLDIRECT,  in the last 8760 hours   Assessment/Plan 1. Essential hypertension -Patient is stable; continue current regimen. Will monitor and make changes as necessary.  2. Dysphagia, pharyngoesophageal phase Stable, no signs of aspiration at this time.   3. Type 2 diabetes mellitus with diabetic chronic kidney disease Blood sugars are elevated on review. 190-200 fasting. Will have staff check blood sugars daily at this time for more information Currently on lanuts 32 units a1c due next month  4. Alzheimer's disease Advanced disease, no changes in the last month   5. Seizures conts on keppra, no noted seizures

## 2014-09-24 ENCOUNTER — Non-Acute Institutional Stay (SKILLED_NURSING_FACILITY): Payer: Medicare Other | Admitting: Adult Health

## 2014-09-24 ENCOUNTER — Encounter: Payer: Self-pay | Admitting: Adult Health

## 2014-09-24 DIAGNOSIS — N183 Chronic kidney disease, stage 3 unspecified: Secondary | ICD-10-CM

## 2014-09-24 DIAGNOSIS — F0280 Dementia in other diseases classified elsewhere without behavioral disturbance: Secondary | ICD-10-CM

## 2014-09-24 DIAGNOSIS — E1122 Type 2 diabetes mellitus with diabetic chronic kidney disease: Secondary | ICD-10-CM

## 2014-09-24 DIAGNOSIS — N189 Chronic kidney disease, unspecified: Secondary | ICD-10-CM

## 2014-09-24 DIAGNOSIS — E079 Disorder of thyroid, unspecified: Secondary | ICD-10-CM

## 2014-09-24 DIAGNOSIS — G309 Alzheimer's disease, unspecified: Secondary | ICD-10-CM

## 2014-09-24 DIAGNOSIS — R569 Unspecified convulsions: Secondary | ICD-10-CM

## 2014-09-24 DIAGNOSIS — I10 Essential (primary) hypertension: Secondary | ICD-10-CM

## 2014-09-24 DIAGNOSIS — R1314 Dysphagia, pharyngoesophageal phase: Secondary | ICD-10-CM

## 2014-09-24 NOTE — Progress Notes (Signed)
Patient ID: [name], female   DOB: 03/20/1929, 80 y.o.   MRN: [id number]  CODE STATUS: DNR with comfort measures  Nursing Home Location:  [name]   Place of Service: SNF (31)  Chief Complaint  Patient presents with  . Medical Management of Chronic Issues    HPI:  80 [facility]. I am here to review his/her chronic medical issues.  VS have been stable over the past month. Weight has remained stable at 198 lbs. She is mostly non verbal and spends most of her time in the gerichair and requires assistance for all ADL's.  The nurses have no issues regarding her medical care.   Review of Systems:  Review of Systems  Unable to perform ROS: Dementia    Medications: Patient's Medications  New Prescriptions   No medications on file  Previous Medications   ACETAMINOPHEN (TYLENOL) 500 MG TABLET    Take 500 mg by mouth 2 (two) times daily. For pain   ASPIRIN 81 MG CHEWABLE TABLET    Chew 81 mg by mouth every morning. For CVT   DOCUSATE SODIUM (COLACE) 100 MG CAPSULE    Take 100 mg by mouth at bedtime. For constipation   INSULIN GLARGINE (LANTUS) 100 UNIT/ML INJECTION    Inject 0.32 mLs (32 Units total) into the skin at bedtime. For diabetes   LEVETIRACETAM (KEPPRA) 250 MG TABLET    Take 1 tablet (250 mg total) by mouth every 12 (twelve) hours.   LEVOTHYROXINE (SYNTHROID, LEVOTHROID) 125 MCG TABLET    Take 125 mcg by mouth daily before breakfast. For thyroid therapy   LISINOPRIL (PRINIVIL,ZESTRIL) 5 MG TABLET    Take 5 mg by mouth daily. For CHF   POLYETHYLENE GLYCOL (MIRALAX / GLYCOLAX) PACKET    Take 17 g by mouth daily. For constipation  Modified Medications   No medications on file  Discontinued Medications   No medications on file     Physical Exam:  Filed Vitals:   09/24/14 1513  BP: 151/76  Pulse: 60  Weight: 198 lb (89.812 kg)    Physical Exam  Constitutional: No distress.  overweight  HENT:  Head:  Normocephalic and atraumatic.  Eyes: Pupils are equal, round, and reactive to light.  Neck: No JVD present. No thyromegaly present.  Cardiovascular: Normal rate and regular rhythm.   No murmur heard. BLE edema +2  Abdominal: Soft. Bowel sounds are normal. She exhibits no distension. There is no tenderness.  Musculoskeletal:  Decreased ROM to both shoulders, both knees, hips. Foot drop on the left.  Lymphadenopathy:    She has no cervical adenopathy.  Neurological: She is alert.  Oriented x1. Mostly non verbal and not able to f/c with consistency  Skin: Skin is warm and dry. She is not diaphoretic. There is pallor.  Psychiatric:  No behavioral issue    Labs reviewed/Significant Diagnostic Results:  Basic Metabolic Panel:  Recent Labs  40/98/1100/04/15 06/08/14  NA 142 138  K 4.4 4.4  BUN 26* 27*  CREATININE 1.0 1.1   Liver Function Tests:  Recent Labs  11/06/13 06/08/14  AST 16 14  ALT 10 12  ALKPHOS 62 62   No results for input(s): LIPASE, AMYLASE in the last 8760 hours. No results for input(s): AMMONIA in the last 8760 hours. CBC:  Recent Labs  11/06/13 06/08/14  WBC 6.6 6.6  HGB 12.0 11.8*  HCT 35* 35*  PLT 216 217   CBG: No results  for input(s): GLUCAP in the last 8760 hours. TSH:  Recent Labs  06/08/14  TSH 4.11   A1C: Lab Results  Component Value Date   HGBA1C 6.9* 06/08/2014     Assessment/Plan  Seizures No reported seizure activity. Continue Keppra BID.  Diabetes mellitus with chronic kidney disease Blood sugars 161-256, fasting. Increase Lantus to 42 units qday.  Alzheimer's disease Mostly non verbal, requires total care. Continue with supportive care, no RX. Unable to participate in MMSE  CKD (chronic kidney disease) stage 3, GFR 30-59 ml/min Cr Cl 40 ml/min. No significant change. Currently on Lisinopril 5mg , tolerating well. Continue regimen and monitor.  Thyroid disease TSH slightly above goal. Continue current dose and recheck  q6915mo  Hypertension BP slightly above goal, given her age and debility would not change lisinopril dose  Dysphagia, pharyngoesophageal phase Currently on mech soft diet with ground meats and TL, tolerating well. Continue and monitor for signs of aspiration    [name], ANP [facility] [phone]

## 2014-09-24 NOTE — Assessment & Plan Note (Signed)
No reported seizure activity. Continue Keppra BID.

## 2014-09-24 NOTE — Assessment & Plan Note (Signed)
Currently on mech soft diet with ground meats and TL, tolerating well. Continue and monitor for signs of aspiration

## 2014-09-24 NOTE — Assessment & Plan Note (Signed)
BP slightly above goal, given her age and debility would not change lisinopril dose

## 2014-09-24 NOTE — Assessment & Plan Note (Signed)
Blood sugars 161-256, fasting. Increase Lantus to 42 units qday.

## 2014-09-24 NOTE — Assessment & Plan Note (Signed)
TSH slightly above goal. Continue current dose and recheck q5641mo

## 2014-09-24 NOTE — Assessment & Plan Note (Addendum)
Mostly non verbal, requires total care. Continue with supportive care, no RX. Unable to participate in MMSE 

## 2014-09-24 NOTE — Assessment & Plan Note (Signed)
Cr Cl 40 ml/min. No significant change. Currently on Lisinopril 5mg , tolerating well. Continue regimen and monitor.

## 2014-10-28 ENCOUNTER — Non-Acute Institutional Stay (SKILLED_NURSING_FACILITY): Payer: Medicare Other | Admitting: Adult Health

## 2014-10-28 ENCOUNTER — Encounter: Payer: Self-pay | Admitting: Adult Health

## 2014-10-28 DIAGNOSIS — G309 Alzheimer's disease, unspecified: Secondary | ICD-10-CM

## 2014-10-28 DIAGNOSIS — F0280 Dementia in other diseases classified elsewhere without behavioral disturbance: Secondary | ICD-10-CM

## 2014-10-28 DIAGNOSIS — E1122 Type 2 diabetes mellitus with diabetic chronic kidney disease: Secondary | ICD-10-CM

## 2014-10-28 DIAGNOSIS — I10 Essential (primary) hypertension: Secondary | ICD-10-CM

## 2014-10-28 DIAGNOSIS — R569 Unspecified convulsions: Secondary | ICD-10-CM

## 2014-10-28 DIAGNOSIS — N189 Chronic kidney disease, unspecified: Secondary | ICD-10-CM

## 2014-10-28 DIAGNOSIS — R1314 Dysphagia, pharyngoesophageal phase: Secondary | ICD-10-CM

## 2014-10-28 NOTE — Assessment & Plan Note (Addendum)
Improved, CBGs range 151-199.  Continue Lantus 42 units daily. A1C next draw

## 2014-10-28 NOTE — Assessment & Plan Note (Signed)
Stable, no reported seizures. Continue current meds.

## 2014-10-28 NOTE — Assessment & Plan Note (Signed)
Continue current diet of mech soft with thin liquids. Stable, no signs of aspiration.

## 2014-10-28 NOTE — Assessment & Plan Note (Signed)
Mostly non verbal, requires total care. Continue with supportive care, no RX. Unable to participate in MMSE

## 2014-10-28 NOTE — Assessment & Plan Note (Signed)
Elevated for the past two readings. Check manually QD for 5 days and report in notebook

## 2014-10-28 NOTE — Progress Notes (Signed)
Patient ID: Maria ChurnBarbara Parks, female   DOB: 03/13/1929, [age] y.o.   MRN: [id number]    Patient ID: [name], female   DOB: 02/07/1929, 80 y.o.   MRN: [id number]  CODE STATUS: DNR with comfort measures  Nursing Home Location:  [name]   Place of Service: SNF (31)  Chief Complaint  Patient presents with  . Medical Management of Chronic Issues    HPI:  80 y.o.  [facility]. I am here to review his/her chronic medical issues.  VS have been stable over the past month. Weight has remained stable at 200 lbs. She is mostly non verbal and spends most of her time in the gerichair and requires assistance for all ADL's.  The nurses have no issues regarding her medical care. Her blood sugars have improved ranging 151-199. Her BP has been elevated for the past two readings (166/95).    Review of Systems:  Review of Systems  Unable to perform ROS: Dementia    Medications: Patient's Medications  New Prescriptions   No medications on file  Previous Medications   ACETAMINOPHEN (TYLENOL) 500 MG TABLET    Take 500 mg by mouth 2 (two) times daily. For pain   ASPIRIN 81 MG CHEWABLE TABLET    Chew 81 mg by mouth every morning. For CVT   DOCUSATE SODIUM (COLACE) 100 MG CAPSULE    Take 100 mg by mouth at bedtime. For constipation   INSULIN GLARGINE (LANTUS) 100 UNIT/ML INJECTION    Inject 0.32 mLs (32 Units total) into the skin at bedtime. For diabetes   LEVETIRACETAM (KEPPRA) 250 MG TABLET    Take 1 tablet (250 mg total) by mouth every 12 (twelve) hours.   LEVOTHYROXINE (SYNTHROID, LEVOTHROID) 125 MCG TABLET    Take 125 mcg by mouth daily before breakfast. For thyroid therapy   LISINOPRIL (PRINIVIL,ZESTRIL) 5 MG TABLET    Take 5 mg by mouth daily. For CHF   POLYETHYLENE GLYCOL (MIRALAX / GLYCOLAX) PACKET    Take 17 g by mouth daily. For constipation  Modified Medications   No medications on file  Discontinued Medications   No medications on file      Physical Exam:  Filed Vitals:   10/28/14 1619  BP: 166/95  Pulse: 71  Temp: 97.9 F (36.6 C)  Resp: 20  Weight: 200 lb (90.719 kg)    Physical Exam  Constitutional: No distress.  overweight  HENT:  Head: Normocephalic and atraumatic.  Eyes: Pupils are equal, round, and reactive to light.  Neck: No JVD present. No thyromegaly present.  Cardiovascular: Normal rate and regular rhythm.   No murmur heard. BLE edema +2  Abdominal: Soft. Bowel sounds are normal. She exhibits no distension. There is no tenderness.  Musculoskeletal:  Decreased ROM to both shoulders, both knees, hips. Foot drop on the left.  Lymphadenopathy:    She has no cervical adenopathy.  Neurological: She is alert.  Oriented x1. Mostly non verbal and not able to f/c with consistency  Skin: Skin is warm and dry. She is not diaphoretic. There is pallor.  Psychiatric:  No behavioral issue    Labs reviewed/Significant Diagnostic Results:  Basic Metabolic Panel:  Recent Labs  08/65/7800/04/15 06/08/14  NA 142 138  K 4.4 4.4  BUN 26* 27*  CREATININE 1.0 1.1   Liver Function Tests:  Recent Labs  11/06/13 06/08/14  AST 16 14  ALT 10 12  ALKPHOS 62 62   No results for input(s): LIPASE,  AMYLASE in the last 8760 hours. No results for input(s): AMMONIA in the last 8760 hours. CBC:  Recent Labs  11/06/13 06/08/14  WBC 6.6 6.6  HGB 12.0 11.8*  HCT 35* 35*  PLT 216 217   CBG: No results for input(s): GLUCAP in the last 8760 hours. TSH:  Recent Labs  06/08/14  TSH 4.11   A1C: Lab Results  Component Value Date   HGBA1C 6.9* 06/08/2014     Assessment/Plan  Diabetes mellitus with chronic kidney disease Improved, CBGs range 151-199.  Continue Lantus 42 units daily. A1C next draw  Hypertension Elevated for the past two readings. Check manually QD for 5 days and report in notebook  Dysphagia, pharyngoesophageal phase Continue current diet of mech soft with thin liquids. Stable, no signs  of aspiration.  Alzheimer's disease Mostly non verbal, requires total care. Continue with supportive care, no RX. Unable to participate in MMSE  Seizures Stable, no reported seizures. Continue current meds.    Peggye Leyhristy Deundra Bard, ANP [facility] [phone]

## 2014-10-29 LAB — HEMOGLOBIN A1C: HEMOGLOBIN A1C: 6.9 % — AB (ref 4.0–6.0)

## 2014-11-06 ENCOUNTER — Encounter: Payer: Self-pay | Admitting: Internal Medicine

## 2014-11-06 ENCOUNTER — Non-Acute Institutional Stay (SKILLED_NURSING_FACILITY): Payer: Medicare Other | Admitting: Internal Medicine

## 2014-11-06 DIAGNOSIS — M25474 Effusion, right foot: Secondary | ICD-10-CM

## 2014-11-06 NOTE — Progress Notes (Signed)
Patient ID: Maria ChurnBarbara Dematteo, female   DOB: 04/28/1929, 81 y.o.   MRN: [id number]  Location:  [facility] Provider:  [place] Joshi L. Renato Gailseed, D.O., C.M.D.  Code Status:  DNR  Chief Complaint  Patient presents with  . Acute Visit    right foot swelling noted after resident was transported [place] the [facility]     HPI:  81 yo white female long term care resident in SNF seen after swelling was noted in her right foot after transport back from the beauty shop.  Apparently, her right foot was dragging under her wheelchair.  She has advanced dementia and speaks only minimally, but can say ouch and admit to pain when present.  She denies pain when asked and when her foot is moved and palpated.  She does have edema and discoloration of the foot, but this is also present in her left foot and ankle and she wears ted hose.  She has tylenol available for pain which is scheduled at present. Review of Systems:  Review of Systems  Constitutional: Positive for chills.  Musculoskeletal: Negative for joint pain and falls.  Psychiatric/Behavioral: Positive for memory loss.    Medications: Patient's Medications  New Prescriptions   No medications on file  Previous Medications   ACETAMINOPHEN (TYLENOL) 500 MG TABLET    Take 500 mg by mouth 2 (two) times daily. For pain   ASPIRIN 81 MG CHEWABLE TABLET    Chew 81 mg by mouth every morning. For CVT   DOCUSATE SODIUM (COLACE) 100 MG CAPSULE    Take 100 mg by mouth at bedtime. For constipation   INSULIN GLARGINE (LANTUS) 100 UNIT/ML INJECTION    Inject 0.32 mLs (32 Units total) into the skin at bedtime. For diabetes   LEVETIRACETAM (KEPPRA) 250 MG TABLET    Take 1 tablet (250 mg total) by mouth every 12 (twelve) hours.   LEVOTHYROXINE (SYNTHROID, LEVOTHROID) 125 MCG TABLET    Take 125 mcg by mouth daily before breakfast. For thyroid therapy   LISINOPRIL (PRINIVIL,ZESTRIL) 5 MG TABLET    Take 5 mg by mouth daily. For CHF   POLYETHYLENE GLYCOL (MIRALAX  / GLYCOLAX) PACKET    Take 17 g by mouth daily. For constipation  Modified Medications   No medications on file  Discontinued Medications   No medications on file    Physical Exam: Filed Vitals:   11/06/14 1658  BP: 119/71  Pulse: 75  Temp: 98.2 F (36.8 C)  Resp: 22  Weight: 197 lb 9.6 oz (89.631 kg)  SpO2: 94%   Physical Exam  Constitutional: She appears well-developed and well-nourished.  Pulmonary/Chest: Effort normal.  Musculoskeletal:  Seat in wheelchair with feet dangling;  Right foot is mildly more edematous than left with purple discoloration most notable in toes and heel;  I was able to dorsiflex and plantar flex her foot without evidence of pain  Neurological: She is alert.  Minimally verbal  Psychiatric:  Flat affect     Labs reviewed: Basic Metabolic Panel:  Recent Labs  87/56/4307/06/16  NA 138  K 4.4  BUN 27*  CREATININE 1.1    Liver Function Tests:  Recent Labs  06/08/14  AST 14  ALT 12  ALKPHOS 62    CBC:  Recent Labs  06/08/14  WBC 6.6  HGB 11.8*  HCT 35*  PLT 217   Assessment/Plan 1. Swelling of foot joint, right -does not appear significantly more swollen than right after incident above -nursing advised to elevate  and monitor -pt receives scheduled tylenol -notify us if swelling worsens, pt has difficulty moving the foot or it becomes painful, discolored in which case xrays of her foot and ankle should be ordered promptly  Family/ staff Communication: discussed with her nurse and nursing supervisor

## 2014-11-28 ENCOUNTER — Non-Acute Institutional Stay (SKILLED_NURSING_FACILITY): Payer: Medicare Other | Admitting: Adult Health

## 2014-11-28 ENCOUNTER — Encounter: Payer: Self-pay | Admitting: Adult Health

## 2014-11-28 DIAGNOSIS — R569 Unspecified convulsions: Secondary | ICD-10-CM

## 2014-11-28 DIAGNOSIS — F0280 Dementia in other diseases classified elsewhere without behavioral disturbance: Secondary | ICD-10-CM

## 2014-11-28 DIAGNOSIS — E079 Disorder of thyroid, unspecified: Secondary | ICD-10-CM

## 2014-11-28 DIAGNOSIS — N189 Chronic kidney disease, unspecified: Secondary | ICD-10-CM

## 2014-11-28 DIAGNOSIS — R1314 Dysphagia, pharyngoesophageal phase: Secondary | ICD-10-CM

## 2014-11-28 DIAGNOSIS — I10 Essential (primary) hypertension: Secondary | ICD-10-CM

## 2014-11-28 DIAGNOSIS — G309 Alzheimer's disease, unspecified: Secondary | ICD-10-CM

## 2014-11-28 DIAGNOSIS — E1122 Type 2 diabetes mellitus with diabetic chronic kidney disease: Secondary | ICD-10-CM

## 2014-11-28 DIAGNOSIS — N183 Chronic kidney disease, stage 3 unspecified: Secondary | ICD-10-CM

## 2014-11-28 NOTE — Assessment & Plan Note (Signed)
Continue mech soft diet with TL, no signs of aspiration

## 2014-11-28 NOTE — Assessment & Plan Note (Signed)
Stable, check TSH

## 2014-11-28 NOTE — Progress Notes (Signed)
Patient ID: [name], female   DOB: 04/04/1929, 81 y.o.   MRN: [id number]  CODE STATUS: DNR with comfort measures  Nursing Home Location:  [name]   Place of Service: SNF (31)  Chief Complaint  Patient presents with  . Medical Management of Chronic Issues    HPI:  81 y.o.  [facility]. I am here to review her chronic medical issues.  She has a hx of AD, HTN, TIA, DM2, Hypothyroidism, and seizures. Weight has remained stable at 197 lbs. She is mostly non verbal and spends most of her time in the gerichair and requires assistance for all ADL's.  The nurses have no issues regarding her medical care. Her blood sugars have improved ranging 128-187. The staff reported a HR of 100 and a BP of 160/98 so they are monitoring her VS daily. Since then her BP and HR have returned to normal. There were no other associated findings.   Review of Systems:  Review of Systems  Unable to perform ROS: Dementia    Medications: Patient's Medications  New Prescriptions   No medications on file  Previous Medications   ACETAMINOPHEN (TYLENOL) 500 MG TABLET    Take 500 mg by mouth 2 (two) times daily. For pain   ASPIRIN 81 MG CHEWABLE TABLET    Chew 81 mg by mouth every morning. For CVT   DOCUSATE SODIUM (COLACE) 100 MG CAPSULE    Take 100 mg by mouth at bedtime. For constipation   INSULIN GLARGINE (LANTUS) 100 UNIT/ML INJECTION    Inject 0.32 mLs (32 Units total) into the skin at bedtime. For diabetes   LEVETIRACETAM (KEPPRA) 250 MG TABLET    Take 1 tablet (250 mg total) by mouth every 12 (twelve) hours.   LEVOTHYROXINE (SYNTHROID, LEVOTHROID) 125 MCG TABLET    Take 125 mcg by mouth daily before breakfast. For thyroid therapy   LISINOPRIL (PRINIVIL,ZESTRIL) 5 MG TABLET    Take 5 mg by mouth daily. For CHF   POLYETHYLENE GLYCOL (MIRALAX / GLYCOLAX) PACKET    Take 17 g by mouth daily. For constipation  Modified Medications   No medications on  file  Discontinued Medications   No medications on file     Physical Exam:  Filed Vitals:   11/28/14 1112  BP: 128/70  Pulse: 90  Temp: 97.2 F (36.2 C)  Resp: 20  Weight: 197 lb 9.6 oz (89.631 kg)  SpO2: 96%    Physical Exam  Constitutional: No distress.  overweight  HENT:  Head: Normocephalic and atraumatic.  Eyes: Pupils are equal, round, and reactive to light.  Neck: No JVD present. No thyromegaly present.  Cardiovascular: Normal rate and regular rhythm.   No murmur heard. BLE edema +2  Abdominal: Soft. Bowel sounds are normal. She exhibits no distension. There is no tenderness.  Musculoskeletal:  Decreased ROM to both shoulders, both knees, hips. Foot drop on the left.  Lymphadenopathy:    She has no cervical adenopathy.  Neurological: She is alert.  Oriented x1. Mostly non verbal and not able to f/c with consistency  Skin: Skin is warm and dry. She is not diaphoretic. There is pallor.  Psychiatric:  No behavioral issue    Labs reviewed/Significant Diagnostic Results:  Basic Metabolic Panel:  Recent Labs  40/98/1107/06/16  NA 138  K 4.4  BUN 27*  CREATININE 1.1   Liver Function Tests:  Recent Labs  06/08/14  AST 14  ALT 12  ALKPHOS 62  No results for input(s): LIPASE, AMYLASE in the last 8760 hours. No results for input(s): AMMONIA in the last 8760 hours. CBC:  Recent Labs  06/08/14  WBC 6.6  HGB 11.8*  HCT 35*  PLT 217   CBG: No results for input(s): GLUCAP in the last 8760 hours. TSH:  Recent Labs  06/08/14  TSH 4.11   A1C: Lab Results  Component Value Date   HGBA1C 6.9* 10/29/2014     Assessment/Plan  Dysphagia, pharyngoesophageal phase Continue mech soft diet with TL, no signs of aspiration   Diabetes mellitus with chronic kidney disease CBGs range 128-187.  Continue lantus 32 units daily. Goal A1C <8%.     Thyroid disease Stable, check TSH   Seizures No reports of seizures, continue Keppra   CKD (chronic kidney  disease) stage 3, GFR 30-59 ml/min Continue Lisinopril, check CMP   Alzheimer's disease Severe, not on meds. Currently non verbal, non ambulatory. Continue supportive care.    Hypertension BP elevated yesterday but now normal. Continue to monitor BP and check labs     Peggye Ley, ANP [facility] [phone]

## 2014-11-28 NOTE — Assessment & Plan Note (Signed)
Severe, not on meds. Currently non verbal, non ambulatory. Continue supportive care.

## 2014-11-28 NOTE — Assessment & Plan Note (Signed)
No reports of seizures, continue Keppra

## 2014-11-28 NOTE — Assessment & Plan Note (Signed)
Continue Lisinopril, check CMP

## 2014-11-28 NOTE — Assessment & Plan Note (Signed)
BP elevated yesterday but now normal. Continue to monitor BP and check labs

## 2014-11-28 NOTE — Assessment & Plan Note (Signed)
CBGs range 128-187.  Continue lantus 32 units daily. Goal A1C <8%.

## 2014-12-03 LAB — BASIC METABOLIC PANEL
BUN: 25 mg/dL — AB (ref 4–21)
Creatinine: 1 mg/dL (ref 0.5–1.1)
GLUCOSE: 141 mg/dL
Potassium: 4.4 mmol/L (ref 3.4–5.3)
Sodium: 141 mmol/L (ref 137–147)

## 2014-12-03 LAB — HEPATIC FUNCTION PANEL
ALK PHOS: 60 U/L (ref 25–125)
ALT: 11 U/L (ref 7–35)
AST: 15 U/L (ref 13–35)
Bilirubin, Total: 0.5 mg/dL

## 2014-12-03 LAB — TSH: TSH: 0.28 u[IU]/mL — AB (ref <=5.90)

## 2014-12-03 LAB — CBC AND DIFFERENTIAL
HEMATOCRIT: 34 % — AB (ref 36–46)
HEMOGLOBIN: 11.6 g/dL — AB (ref 12.0–16.0)
Platelets: 214 10*3/uL (ref 150–399)
WBC: 6.2 10*3/mL

## 2014-12-31 ENCOUNTER — Non-Acute Institutional Stay (SKILLED_NURSING_FACILITY): Payer: Medicare Other | Admitting: Adult Health

## 2014-12-31 ENCOUNTER — Encounter: Payer: Self-pay | Admitting: Adult Health

## 2014-12-31 DIAGNOSIS — G309 Alzheimer's disease, unspecified: Secondary | ICD-10-CM

## 2014-12-31 DIAGNOSIS — R569 Unspecified convulsions: Secondary | ICD-10-CM

## 2014-12-31 DIAGNOSIS — E079 Disorder of thyroid, unspecified: Secondary | ICD-10-CM

## 2014-12-31 DIAGNOSIS — R1314 Dysphagia, pharyngoesophageal phase: Secondary | ICD-10-CM

## 2014-12-31 DIAGNOSIS — N189 Chronic kidney disease, unspecified: Secondary | ICD-10-CM | POA: Diagnosis not present

## 2014-12-31 DIAGNOSIS — I10 Essential (primary) hypertension: Secondary | ICD-10-CM

## 2014-12-31 DIAGNOSIS — E1122 Type 2 diabetes mellitus with diabetic chronic kidney disease: Secondary | ICD-10-CM | POA: Diagnosis not present

## 2014-12-31 DIAGNOSIS — F0280 Dementia in other diseases classified elsewhere without behavioral disturbance: Secondary | ICD-10-CM

## 2014-12-31 NOTE — Progress Notes (Signed)
Patient ID: Genene ChurnBarbara Mccoin, female   DOB: 04/29/1929, 81 y.o.   MRN: [id number]     CODE STATUS: DNR with comfort measures  Nursing Home Location:  [name]   Place of Service: SNF (31)  Chief Complaint  Patient presents with  . Medical Management of Chronic Issues    HPI:  81 y.o.  residing at [facility]. I am here to review her chronic medical issues.  She has a hx of AD, HTN, TIA, DM2, Hypothyroidism, and seizures. Weight is stable at 197.2 lbs. She was pocketing food recently and so her diet was changed to puree with small portions. She is mostly non verbal and non ambulatory due to advanced dementia. Her TSH returned at 0.28 on 12/03/14 and Synthroid was decreased to 112 mcg. There are no complaints regarding her care.   Review of Systems:  Review of Systems  Unable to perform ROS: Dementia    Medications: Patient's Medications  New Prescriptions   No medications on file  Previous Medications   ACETAMINOPHEN (TYLENOL) 500 MG TABLET    Take 500 mg by mouth 2 (two) times daily. For pain   ASPIRIN 81 MG CHEWABLE TABLET    Chew 81 mg by mouth every morning. For CVT   DOCUSATE SODIUM (COLACE) 100 MG CAPSULE    Take 100 mg by mouth at bedtime. For constipation   INSULIN GLARGINE (LANTUS) 100 UNIT/ML INJECTION    Inject 0.32 mLs (32 Units total) into the skin at bedtime. For diabetes   LEVETIRACETAM (KEPPRA) 250 MG TABLET    Take 1 tablet (250 mg total) by mouth every 12 (twelve) hours.   LEVOTHYROXINE (SYNTHROID, LEVOTHROID) 112 MCG TABLET    Take 112 mcg by mouth daily before breakfast.   LISINOPRIL (PRINIVIL,ZESTRIL) 5 MG TABLET    Take 5 mg by mouth daily. For CHF   POLYETHYLENE GLYCOL (MIRALAX / GLYCOLAX) PACKET    Take 17 g by mouth daily. For constipation  Modified Medications   No medications on file  Discontinued Medications   LEVOTHYROXINE (SYNTHROID, LEVOTHROID) 125 MCG TABLET    Take 125 mcg by mouth daily before breakfast. For  thyroid therapy     Physical Exam:  Filed Vitals:   12/31/14 1127  BP: 145/70  Pulse: 57  Temp: 97.3 F (36.3 C)  Resp: 20  Weight: 197 lb 3.2 oz (89.449 kg)  SpO2: 98%    Physical Exam  Constitutional: No distress.  overweight  HENT:  Head: Normocephalic and atraumatic.  Eyes: Pupils are equal, round, and reactive to light.  Neck: No JVD present. No thyromegaly present.  Cardiovascular: Normal rate and regular rhythm.   No murmur heard. No edema.  Abdominal: Soft. Bowel sounds are normal. She exhibits no distension. There is no tenderness.  Musculoskeletal:  Decreased ROM to both shoulders, both knees, hips. Foot drop on the left.  Lymphadenopathy:    She has no cervical adenopathy.  Neurological: She is alert.  Oriented x1. Mostly non verbal and not able to f/c with consistency  Skin: Skin is warm and dry. She is not diaphoretic. There is pallor.  Psychiatric:  No behavioral issue    Labs reviewed/Significant Diagnostic Results:  Basic Metabolic Panel:  Recent Labs  78/29/5607/06/16 12/03/14  NA 138 141  K 4.4 4.4  BUN 27* 25*  CREATININE 1.1 1.0   Liver Function Tests:  Recent Labs  06/08/14 12/03/14  AST 14 15  ALT 12 11  ALKPHOS 62 60  No results for input(s): LIPASE, AMYLASE in the last 8760 hours. No results for input(s): AMMONIA in the last 8760 hours. CBC:  Recent Labs  06/08/14 12/03/14  WBC 6.6 6.2  HGB 11.8* 11.6*  HCT 35* 34*  PLT 217 214   CBG: No results for input(s): GLUCAP in the last 8760 hours. TSH:  Recent Labs  06/08/14 12/03/14  TSH 4.11 0.28*   A1C: Lab Results  Component Value Date   HGBA1C 6.9* 10/29/2014     Assessment/Plan  1. Alzheimer's disease Advanced, now pocketing food. No aggressive measures. Continue current diet.   2. Thyroid disease TSH due in 2 weeks  3. Seizures No new events, continue Keppra  4. Dysphagia, pharyngoesophageal phase Continue puree diet with thin liquids and small portions.  Monitor for aspiration.  5. Essential hypertension Controlled. BMP ok.  6. DM Type II with CKD CBG 135-160. Continue current meds and monitor.     Peggye Ley, ANP [facility] [phone]

## 2015-01-01 ENCOUNTER — Non-Acute Institutional Stay (SKILLED_NURSING_FACILITY): Payer: Medicare Other | Admitting: Internal Medicine

## 2015-01-01 DIAGNOSIS — G309 Alzheimer's disease, unspecified: Secondary | ICD-10-CM | POA: Diagnosis not present

## 2015-01-01 DIAGNOSIS — Z66 Do not resuscitate: Secondary | ICD-10-CM | POA: Diagnosis not present

## 2015-01-01 DIAGNOSIS — F0280 Dementia in other diseases classified elsewhere without behavioral disturbance: Secondary | ICD-10-CM

## 2015-01-01 DIAGNOSIS — R0902 Hypoxemia: Secondary | ICD-10-CM

## 2015-01-01 NOTE — Progress Notes (Signed)
Patient ID: Genene ChurnBarbara Tamargo, female   DOB: 11/22/1928, 81 y.o.   MRN: [id number]   Location:  [facility] Code Status: DNR  Allergies  Allergen Reactions  . Percocet [Oxycodone-Acetaminophen]   . Roxicet [Oxycodone-Acetaminophen] Other (See Comments)    Per medical record- unknown reaction    Chief Complaint  Patient presents with  . Acute Visit    intermittent hypoxia    HPI: Patient is a 81 y.o. female seen in her room today for an episode of decreased oxygen saturation yesterday afternoon. Nursing reports they found her with a decrease in alertness. O2 sats were found at 82-84 percent on room air. Oxygen was started at 2LPM and patient regained her oxygen status. Later, she was able to maintain her oxygen status on room air. Today per nursing she has had a good day. Ate 100 percent of breakfast and adequately moved bowels and urine. Oxygen status this morning was 97 percent on room air.   Review of Systems:  *ROS limited due to patient's advanced dementia. Information obtained from nursing staff Review of Systems  Constitutional: Negative for fever.  HENT:       Dysphagia- on pureed diet  Respiratory: Negative for cough.   Gastrointestinal: Negative for nausea and vomiting.    Past Medical History  Diagnosis Date  . Hypertension   . DEMENTIA   . Thyroid disease   . Diabetes mellitus   . Hyperlipidemia   . Obesity, unspecified 2006  . Depression 2009  . Depressive disorder, not elsewhere classified   . Alzheimer's disease 2007  . External hemorrhoids without mention of complication 2010  . Chronic kidney disease, stage II (mild) 2010  . Lump or mass in breast 2010  . Spinal stenosis, unspecified region other than cervical 2007    s/p surgery 2007  . Senile osteoporosis 2006  . Transient ischemic attack (TIA), and cerebral infarction without residual deficits(V12.54) 09/2011  . Seizures 04/06/2013  . 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 CXR  neagative for Tb    Past Surgical History  Procedure Laterality Date  . Thyroidectomy    . Back surgery  1970  . Thyroidectomy  1966  . Abdominal hysterectomy  1966  . Lumbar laminectomy  9 2007    Central laminectomy L3-4 w/bilateral L3-4 nerve root decompression. Right transforaminal fusion L3-4 w/ 10mm lordotic leopard cage/ local bone graft. Posterolateral L3-4 fusion w/ local/ allograft [facility] bone graft. Pedicle screw/rod fixation L3-4.      Social History:   reports that she has never smoked. She has never used smokeless tobacco. She reports that she does not drink alcohol or use illicit drugs.  Family History  Problem Relation Age of Onset  . Diabetes type II Other     Medications: Patient's Medications  New Prescriptions   No medications on file  Previous Medications   ACETAMINOPHEN (TYLENOL) 500 MG TABLET    Take 500 mg by mouth 2 (two) times daily. For pain   ASPIRIN 81 MG CHEWABLE TABLET    Chew 81 mg by mouth every morning. For CVT   DOCUSATE SODIUM (COLACE) 100 MG CAPSULE    Take 100 mg by mouth at bedtime. For constipation   INSULIN GLARGINE (LANTUS) 100 UNIT/ML INJECTION    Inject 0.32 mLs (32 Units total) into the skin at bedtime. For diabetes   LEVETIRACETAM (KEPPRA) 250 MG TABLET    Take 1 tablet (250 mg total) by mouth every 12 (twelve) hours.  LEVOTHYROXINE (SYNTHROID, LEVOTHROID) 112 MCG TABLET    Take 112 mcg by mouth daily before breakfast.   LISINOPRIL (PRINIVIL,ZESTRIL) 5 MG TABLET    Take 5 mg by mouth daily. For CHF   POLYETHYLENE GLYCOL (MIRALAX / GLYCOLAX) PACKET    Take 17 g by mouth daily. For constipation  Modified Medications   No medications on file  Discontinued Medications   No medications on file     Physical Exam: Filed Vitals:   01/01/15 1130  BP: 156/82  Pulse: 85  Temp: 97.4 F (36.3 C)  Resp: 18  SpO2: 97%   Physical Exam  Constitutional: She appears well-developed and well-nourished. No distress.  Cardiovascular:  Normal rate, regular rhythm, normal heart sounds and intact distal pulses.   Bilateral pedal edema  Pulmonary/Chest: Effort normal and breath sounds normal.  Unable to auscultate posterior breath sounds due to patient positioning  Abdominal: Soft. Bowel sounds are normal. She exhibits no distension. There is no tenderness.  Neurological:  Patient napping, difficult to arouse  Skin: Skin is warm and dry.  Vitals reviewed.    Labs reviewed: Basic Metabolic Panel:  Recent Labs  16/10/96 12/03/14  NA 138 141  K 4.4 4.4  BUN 27* 25*  CREATININE 1.1 1.0  TSH 4.11 0.28*   Liver Function Tests:  Recent Labs  06/08/14 12/03/14  AST 14 15  ALT 12 11  ALKPHOS 62 60   No results for input(s): LIPASE, AMYLASE in the last 8760 hours. No results for input(s): AMMONIA in the last 8760 hours. CBC:  Recent Labs  06/08/14 12/03/14  WBC 6.6 6.2  HGB 11.8* 11.6*  HCT 35* 34*  PLT 217 214   Lipid Panel: No results for input(s): CHOL, HDL, LDLCALC, TRIG, CHOLHDL, LDLDIRECT in the last 8760 hours. Lab Results  Component Value Date   HGBA1C 6.9* 10/29/2014    Past Procedures:  Assessment/Plan 1. Oxygen desaturation Etiology unclear. Possibly advancement of Alzheimer's leading to aspiration. Patient is DNR and has pink MOST form indicating comfort measures only. Continue to monitor mental status and oxygen saturation, offer oxygen for comfort as needed.   2. Alzheimer's disease Advanced. Continue current residence in skilled facility.  Consider hospice referral if her family is amenable to this option due to her dependence in ADLs, poor performance status.  Labs/tests ordered:  No new  Azka Steger L. Kitana Gage, D.O. Geriatrics [facility] [address], [place] [address] Cell Phone (Mon-Fri 8am-5pm):  [phone] On Call:  [phone] & follow prompts after 5pm & weekends Office Phone:  [phone] Office Fax:  [phone]

## 2015-01-03 ENCOUNTER — Encounter: Payer: Self-pay | Admitting: Internal Medicine

## 2015-01-16 ENCOUNTER — Non-Acute Institutional Stay (SKILLED_NURSING_FACILITY): Payer: Medicare Other | Admitting: Adult Health

## 2015-01-16 DIAGNOSIS — R339 Retention of urine, unspecified: Secondary | ICD-10-CM

## 2015-01-16 DIAGNOSIS — G309 Alzheimer's disease, unspecified: Secondary | ICD-10-CM

## 2015-01-16 DIAGNOSIS — E079 Disorder of thyroid, unspecified: Secondary | ICD-10-CM | POA: Diagnosis not present

## 2015-01-16 DIAGNOSIS — F0280 Dementia in other diseases classified elsewhere without behavioral disturbance: Secondary | ICD-10-CM

## 2015-01-16 LAB — TSH: TSH: 0.1 u[IU]/mL — AB (ref <=5.90)

## 2015-01-20 NOTE — Progress Notes (Signed)
Patient ID: Genene ChurnBarbara Cohoon, female   DOB: 10/16/1929, 81 y.o.   MRN: [id number]     CODE STATUS: DNR with comfort measures  Nursing Home Location:  [facility]   Place of Service: SNF (31)  CC: frequent catheterizations, decreased intake  HPI:  81 y.o.  [facility].She has a hx of AD, HTN, TIA, DM2, Hypothyroidism, and seizures. Her dementia has progressed and she is mostly non verbal with decreased intake and refusing to swallow pills. She has had periods of decreased urinary output and the staff has been doing I and O caths. At times there is very little output and other times there has been around 200-250 cc. She apparently has always gone long periods of time between voids and then has one large episode of incontinent urine. She is currently followed by Hospice. Her TSH returned at 0.105 on 01/20/2015   Review of Systems:  Review of Systems  Unable to perform ROS: Dementia    Medications: Patient's Medications  New Prescriptions   No medications on file  Previous Medications   ACETAMINOPHEN (TYLENOL) 500 MG TABLET    Take 500 mg by mouth 2 (two) times daily. For pain   DOCUSATE SODIUM (COLACE) 100 MG CAPSULE    Take 100 mg by mouth at bedtime. For constipation   INSULIN GLARGINE (LANTUS) 100 UNIT/ML INJECTION    Inject 0.32 mLs (32 Units total) into the skin at bedtime. For diabetes   LEVETIRACETAM (KEPPRA) 250 MG TABLET    Take 1 tablet (250 mg total) by mouth every 12 (twelve) hours.   LEVOTHYROXINE (SYNTHROID, LEVOTHROID) 112 MCG TABLET    Take 112 mcg by mouth daily before breakfast.   POLYETHYLENE GLYCOL (MIRALAX / GLYCOLAX) PACKET    Take 17 g by mouth daily. For constipation  Modified Medications   No medications on file  Discontinued Medications   ASPIRIN 81 MG CHEWABLE TABLET    Chew 81 mg by mouth every morning. For CVT   LISINOPRIL (PRINIVIL,ZESTRIL) 5 MG TABLET    Take 5 mg by mouth daily. For CHF     Physical  Exam:  Filed Vitals:   01/16/15 1620  BP: 144/74  Pulse: 78  Temp: 97.5 F (36.4 C)  Resp: 18  SpO2: 98%    Physical Exam  Constitutional: No distress.  overweight  HENT:  Head: Normocephalic and atraumatic.  Eyes: Pupils are equal, round, and reactive to light.  Neck: No JVD present. No thyromegaly present.  Cardiovascular: Normal rate and regular rhythm.   No murmur heard. No edema.  Abdominal: Soft. Bowel sounds are normal. She exhibits no distension. There is no tenderness.  Musculoskeletal:  Decreased ROM to both shoulders, both knees, hips. Foot drop on the left.  Lymphadenopathy:    She has no cervical adenopathy.  Neurological: She is alert.   Mostly non verbal and not able to f/c with consistency  Skin: Skin is warm and dry. She is not diaphoretic. There is pallor.  Psychiatric:  No behavioral issue    Labs reviewed/Significant Diagnostic Results:  Basic Metabolic Panel:  Recent Labs  13/06/6507/08/15 12/03/14  NA 138 141  K 4.4 4.4  BUN 27* 25*  CREATININE 1.1 1.0   Liver Function Tests:  Recent Labs  06/08/14 12/03/14  AST 14 15  ALT 12 11  ALKPHOS 62 60   No results for input(s): LIPASE, AMYLASE in the last 8760 hours. No results for input(s): AMMONIA in the last 8760 hours. CBC:  Recent Labs  06/08/14 12/03/14  WBC 6.6 6.2  HGB 11.8* 11.6*  HCT 35* 34*  PLT 217 214   CBG: No results for input(s): GLUCAP in the last 8760 hours. TSH:  Recent Labs  06/08/14 12/03/14 01/16/15  TSH 4.11 0.28* 0.10*   A1C: Lab Results  Component Value Date   HGBA1C 6.9* 10/29/2014     Assessment/Plan  1. Alzheimer's disease Severe, refusing to eat or take meds at times. Hospice is now following resident. D/C ASA.  Comfort measures in place.    2. Thyroid disease Decrease Synthroid to and recheck TSH in 6 weeks  3. Urinary retention She has periods of time with very little urine output. Mostly related to decreased intake, although there is  some retention. Change catheter order to read QD prn to avoid unnecessary invasive procedures   Peggye Ley, ANP [facility] [phone]

## 2015-02-10 ENCOUNTER — Non-Acute Institutional Stay (SKILLED_NURSING_FACILITY): Payer: Medicare Other | Admitting: Adult Health

## 2015-02-10 ENCOUNTER — Encounter: Payer: Self-pay | Admitting: Adult Health

## 2015-02-10 DIAGNOSIS — E1122 Type 2 diabetes mellitus with diabetic chronic kidney disease: Secondary | ICD-10-CM

## 2015-02-10 DIAGNOSIS — Z9981 Dependence on supplemental oxygen: Secondary | ICD-10-CM | POA: Diagnosis not present

## 2015-02-10 DIAGNOSIS — G309 Alzheimer's disease, unspecified: Secondary | ICD-10-CM

## 2015-02-10 DIAGNOSIS — N189 Chronic kidney disease, unspecified: Secondary | ICD-10-CM

## 2015-02-10 DIAGNOSIS — F0280 Dementia in other diseases classified elsewhere without behavioral disturbance: Secondary | ICD-10-CM

## 2015-02-10 NOTE — Progress Notes (Signed)
Patient ID: Genene ChurnBarbara Ferriss, female   DOB: 05/19/1929, 81 y.o.   MRN: [id number]    Nursing Home Location:  [name]   Code Status: DNR, comfort measures   Place of Service: SNF (31)  Chief Complaint  Patient presents with  . Acute Visit    decreased intake, lethargy, 02 dependent    HPI:  81 [facility].female [facility]. I was asked to see her today due to functional decline, 02 dependency, and decreased intake. She has end stage AD and has begun to close her mouth and not swallow pills or take in food. For the past 1-2 days she has had some congestion and required oral suctioning. Her sats have been in the mid 80's on RA and low 90's on 2L without resp distress. She is bed bound and dependent for all ADL's. She has had decreased intake with intermittent urinary retention. She has an order for I and O cath prn but has not used it in the past few days per the notes. She is on Lantus 20 units daily for DM 2, her blood sugars have ranged 82-175. She is followed by Hospice and her goals of care are comfort.   Review of Systems:  Review of Systems  Unable to perform ROS: Dementia    Medications: Patient's Medications  New Prescriptions   No medications on file  Previous Medications   ACETAMINOPHEN (TYLENOL) 500 MG TABLET    Take 500 mg by mouth 2 (two) times daily. For pain   DOCUSATE SODIUM (COLACE) 100 MG CAPSULE    Take 100 mg by mouth at bedtime. For constipation   INSULIN GLARGINE (LANTUS) 100 UNIT/ML INJECTION    Inject 0.32 mLs (32 Units total) into the skin at bedtime. For diabetes   LEVETIRACETAM (KEPPRA) 250 MG TABLET    Take 1 tablet (250 mg total) by mouth every 12 (twelve) hours.   LEVOTHYROXINE (SYNTHROID, LEVOTHROID) 100 MCG TABLET    Take 100 mcg by mouth daily before breakfast.   POLYETHYLENE GLYCOL (MIRALAX / GLYCOLAX) PACKET    Take 17 g by mouth daily. For constipation  Modified Medications   No  medications on file  Discontinued Medications   LEVOTHYROXINE (SYNTHROID, LEVOTHROID) 112 MCG TABLET    Take 100 mcg by mouth daily before breakfast.      Physical Exam:  Filed Vitals:   02/10/15 1553  BP: 137/79  Pulse: 91  Temp: 98.3 F (36.8 C)  Resp: 19  SpO2: 94%    Physical Exam  Constitutional: No distress.  HENT:  Head: Normocephalic and atraumatic.  Cardiovascular: Normal rate, regular rhythm and normal heart sounds.   Pulmonary/Chest: Effort normal. No respiratory distress.  Bilat rhonchi  Abdominal: Soft. Bowel sounds are normal. She exhibits no distension.  Neurological:  Opens eyes to verbal stimuli, does not f/c  Skin: Skin is warm and dry. She is not diaphoretic.    Labs reviewed/Significant Diagnostic Results:  Basic Metabolic Panel:  Recent Labs  40/98/1107/06/16 12/03/14  NA 138 141  K 4.4 4.4  BUN 27* 25*  CREATININE 1.1 1.0   Liver Function Tests:  Recent Labs  06/08/14 12/03/14  AST 14 15  ALT 12 11  ALKPHOS 62 60   No results for input(s): LIPASE, AMYLASE in the last 8760 hours. No results for input(s): AMMONIA in the last 8760 hours. CBC:  Recent Labs  06/08/14 12/03/14  WBC 6.6 6.2  HGB 11.8* 11.6*  HCT 35* 34*  PLT 217 214   CBG: No results for input(s): GLUCAP in the last 8760 hours. TSH:  Recent Labs  06/08/14 12/03/14 01/16/15  TSH 4.11 0.28* 0.10*   A1C: Lab Results  Component Value Date   HGBA1C 6.9* 10/29/2014   Lipid Panel: No results for input(s): CHOL, HDL, LDLCALC, TRIG, CHOLHDL, LDLDIRECT in the last 8760 hours.     Assessment/Plan  1) Alzheimer's dementia End stage. Continue with supportive care. Hospice to follow resident.   2) 02 dependent She is not in distress and is most likely aspirating due to the terminal nature of her condition and her the resident/family wishes, we will not pursue a dx and only tx symptoms. Continue with oral suctioning prn. She does not appear to need nebs.  3) DM Type  2 Decrease Lantus to 10 units daily due to decreased intake. Will continue to taper off as indicated    Peggye Ley, ANP [facility] [phone]

## 2015-03-02 DEATH — deceased

## 2015-09-11 NOTE — Progress Notes (Signed)
This encounter was created in error - please disregard.

## 2015-10-14 NOTE — Progress Notes (Signed)
This encounter was created in error - please disregard.
